# Patient Record
Sex: Male | Born: 1950
Health system: Southern US, Community
[De-identification: ages and names within clinical notes are randomized; demographics above are authoritative.]

## PROBLEM LIST (undated history)

## (undated) DIAGNOSIS — N200 Calculus of kidney: Secondary | ICD-10-CM

## (undated) DIAGNOSIS — E119 Type 2 diabetes mellitus without complications: Secondary | ICD-10-CM

## (undated) DIAGNOSIS — E079 Disorder of thyroid, unspecified: Secondary | ICD-10-CM

## (undated) DIAGNOSIS — G4730 Sleep apnea, unspecified: Secondary | ICD-10-CM

## (undated) DIAGNOSIS — E039 Hypothyroidism, unspecified: Secondary | ICD-10-CM

## (undated) DIAGNOSIS — E7800 Pure hypercholesterolemia, unspecified: Secondary | ICD-10-CM

## (undated) HISTORY — PX: COLONOSCOPY: SHX174

## (undated) HISTORY — DX: Sleep apnea, unspecified: G47.30

## (undated) HISTORY — DX: Disorder of thyroid, unspecified: E07.9

## (undated) HISTORY — DX: Hypothyroidism, unspecified: E03.9

## (undated) HISTORY — DX: Calculus of kidney: N20.0

---

## 2004-04-03 ENCOUNTER — Ambulatory Visit (HOSPITAL_COMMUNITY): Admission: RE | Admit: 2004-04-03 | Discharge: 2004-04-03 | Payer: Self-pay | Admitting: Family Medicine

## 2013-02-21 ENCOUNTER — Ambulatory Visit (INDEPENDENT_AMBULATORY_CARE_PROVIDER_SITE_OTHER): Payer: BC Managed Care – PPO | Admitting: Endocrinology

## 2013-02-21 ENCOUNTER — Encounter: Payer: Self-pay | Admitting: Endocrinology

## 2013-02-21 VITALS — BP 134/80 | HR 80 | Ht 67.0 in | Wt 208.0 lb

## 2013-02-21 DIAGNOSIS — N209 Urinary calculus, unspecified: Secondary | ICD-10-CM

## 2013-02-21 DIAGNOSIS — E0590 Thyrotoxicosis, unspecified without thyrotoxic crisis or storm: Secondary | ICD-10-CM

## 2013-02-21 DIAGNOSIS — E785 Hyperlipidemia, unspecified: Secondary | ICD-10-CM | POA: Insufficient documentation

## 2013-02-21 NOTE — Patient Instructions (Signed)
let's check a thyroid "scan" (a special, but easy and painless type of thyroid x ray).  you will receive a phone call, about a day and time for an appointment.  It works like this: you go to the x-ray department of the hospital to swallow a pill, which contains a miniscule amount of radiation.  You will not notice any symptoms from this.  You will go back to the x-ray department the next day, to lie down in front of a camera.  The results of this will be sent to me.   Based on the results, i hope to order for you a treatment pill of radioactive iodine.  Although it is a larger amount of radiation, you will again notice no symptoms from this.  The pill is gone from your body in a few days (during which you should stay away from other people), but takes several months to work.  Therefore, please return here approximately 6-8 weeks after the treatment.  This treatment has been available for many years, and the only known side-effect is an underactive thyroid.  It is possible that i would eventually prescribe for you a thyroid hormone pill, which is very inexpensive.  You don't have to worry about side-effects of this thyroid hormone pill, because it is the same molecule your thyroid makes.

## 2013-02-21 NOTE — Progress Notes (Signed)
  Subjective:    Patient ID: Darrell Rivas, male    DOB: 12-06-1950, 62 y.o.   MRN: [id number]  HPI Pt says approx 10 years ago, he was noted to have slight proptosis of the right eye.  He was dx'ed with grave's dz.  He was found to have associated abnormal TFT, and was rx'ed with an uncertain type of thyroid medication.  He has been off this for a few years, with normal TFT.  approx 4 years ago, he had aspiration of fluid from a thyroid cyst.  The TFT stayed normal until the most recent one.  He was rx'ed with toprol, but says he takes it just prn.    No past medical history on file.  No past surgical history on file.  History   Social History  . Marital Status: Widowed    Spouse Name: N/A    Number of Children: N/A  . Years of Education: N/A   Occupational History  . Not on file.   Social History Main Topics  . Smoking status: Not on file  . Smokeless tobacco: Not on file  . Alcohol Use: Not on file  . Drug Use: Not on file  . Sexually Active: Not on file   Other Topics Concern  . Not on file   Social History Narrative  . No narrative on file    No current outpatient prescriptions on file prior to visit.   No current facility-administered medications on file prior to visit.    Not on File  No family history on file. No thyroid probs BP 134/80  Pulse 80  Ht 5\' 6"  (1.676 m)  Wt 208 lb (94.348 kg)  BMI 33.59 kg/m2  SpO2 98%    Review of Systems He has slight headache and heat intolerance.  denies weight loss, hoarseness, double vision, palpitations, sob, diarrhea, polyuria, myalgias, excessive diaphoresis, numbness, tremor, anxiety, hypoglycemia, easy bruising, and rhinorrhea.      Objective:   Physical Exam VS: see vs page GEN: no distress HEAD: head: no deformity eyes: no periorbital swelling, but there is slight right-sided proptosis external nose and ears are normal mouth: no lesion seen NECK: supple, thyroid is not enlarged CHEST WALL: no  deformity LUNGS: clear to auscultation BREASTS:  No gynecomastia CV: reg rate and rhythm, no murmur. ABD: abdomen is soft, nontender.  no hepatosplenomegaly.  not distended.  no hernia MUSCULOSKELETAL: muscle bulk and strength are grossly normal.  no obvious joint swelling.  gait is normal and steady.   EXTEMITIES: no deformity.  no ulcer on the feet.  feet are of normal color and temp.  Trace bilat leg edema.   PULSES: dorsalis pedis intact bilat.  no carotid bruit.   NEURO:  cn 2-12 grossly intact.   readily moves all 4's.  sensation is intact to touch on all 4's.  No tremor. SKIN:  Normal texture and temperature.  No rash or suspicious lesion is visible.  Slightly diaphoretic.   NODES:  None palpable at the neck.   PSYCH: alert, oriented x3.  Does not appear anxious nor depressed.   outside test results are reviewed: TSH=0.005 Free T4=2.8    Assessment & Plan:  Grave's dz, affecting the right eye Hyperthyroidism, due to the grave's dz Apparent h/o thyroid cyst.  Not apparent on physical exam today.

## 2013-03-08 ENCOUNTER — Telehealth: Payer: Self-pay | Admitting: Endocrinology

## 2013-03-08 ENCOUNTER — Other Ambulatory Visit: Payer: Self-pay | Admitting: Endocrinology

## 2013-03-08 DIAGNOSIS — E0590 Thyrotoxicosis, unspecified without thyrotoxic crisis or storm: Secondary | ICD-10-CM

## 2013-03-08 NOTE — Telephone Encounter (Signed)
please call patient: i ordered iodine therapy.  you will receive a phone call, about a day and time for an appointment. Please come back for a follow-up appointment 6 weeks later.

## 2013-03-24 ENCOUNTER — Encounter: Payer: Self-pay | Admitting: Endocrinology

## 2013-03-27 ENCOUNTER — Encounter (HOSPITAL_COMMUNITY): Payer: BC Managed Care – PPO

## 2013-03-28 ENCOUNTER — Encounter (HOSPITAL_COMMUNITY): Payer: BC Managed Care – PPO

## 2013-04-04 ENCOUNTER — Telehealth: Payer: Self-pay

## 2013-04-04 ENCOUNTER — Encounter (HOSPITAL_COMMUNITY)
Admission: RE | Admit: 2013-04-04 | Discharge: 2013-04-04 | Disposition: A | Discharge status: Home / Self Care | Payer: BC Managed Care – PPO | Source: Ambulatory Visit | Attending: Endocrinology | Admitting: Endocrinology

## 2013-04-04 ENCOUNTER — Other Ambulatory Visit: Payer: Self-pay | Admitting: Endocrinology

## 2013-04-04 ENCOUNTER — Encounter (HOSPITAL_COMMUNITY): Admission: RE | Admit: 2013-04-04 | Payer: BC Managed Care – PPO | Source: Ambulatory Visit

## 2013-04-04 ENCOUNTER — Encounter (HOSPITAL_COMMUNITY): Payer: Self-pay

## 2013-04-04 DIAGNOSIS — E0590 Thyrotoxicosis, unspecified without thyrotoxic crisis or storm: Secondary | ICD-10-CM | POA: Insufficient documentation

## 2013-04-04 MED ORDER — SODIUM IODIDE I 131 CAPSULE
30.2000 | Freq: Once | INTRAVENOUS | Status: AC | PRN
  Administered 2013-04-04: 30.2 via ORAL

## 2013-04-04 NOTE — Telephone Encounter (Signed)
Darrell Rivas from radiology called pt had a thyroid uptake and scan at [facility] on 5 /28/14, pt does not needs another, will cancel uptake and do therapy

## 2013-04-05 ENCOUNTER — Encounter (HOSPITAL_COMMUNITY): Payer: BC Managed Care – PPO

## 2013-04-06 ENCOUNTER — Encounter (HOSPITAL_COMMUNITY): Payer: BC Managed Care – PPO

## 2013-07-21 ENCOUNTER — Telehealth: Payer: Self-pay | Admitting: Endocrinology

## 2013-07-21 NOTE — Telephone Encounter (Signed)
Pt wanted to male a f/u appt  Scheduled for Monday at 4 pm

## 2013-07-24 ENCOUNTER — Ambulatory Visit (INDEPENDENT_AMBULATORY_CARE_PROVIDER_SITE_OTHER): Payer: BC Managed Care – PPO | Admitting: Endocrinology

## 2013-07-24 ENCOUNTER — Encounter: Payer: Self-pay | Admitting: Endocrinology

## 2013-07-24 VITALS — BP 140/90 | HR 69 | Temp 98.3°F | Wt 228.8 lb

## 2013-07-24 DIAGNOSIS — E890 Postprocedural hypothyroidism: Secondary | ICD-10-CM

## 2013-07-24 DIAGNOSIS — E0590 Thyrotoxicosis, unspecified without thyrotoxic crisis or storm: Secondary | ICD-10-CM

## 2013-07-24 NOTE — Progress Notes (Signed)
  Subjective:    Patient ID: Darrell Rivas, male    DOB: 23-Jun-1951, 62 y.o.   MRN: [id number]  HPI In 2004, pt was noted to have slight proptosis of the right eye.  He was dx'ed with grave's dz.  He was found to have associated abnormal TFT, and was rx'ed with an uncertain type of thyroid medication.  He has been off this since approx 2012, with normal TFT.  In approx 2010, he had aspiration of fluid from a thyroid cyst.  The TFT stayed normal until mid-2014.  He had 1-131 rx in July of 2014.  In august of 2014, he was seen in [facility] ER for moderate lump at the anterior neck, but no assoc pain.  He says it is smaller since then. Past Medical History  Diagnosis Date  . Thyroid disease     No past surgical history on file.  History   Social History  . Marital Status: Widowed    Spouse Name: N/A    Number of Children: N/A  . Years of Education: N/A   Occupational History  . Not on file.   Social History Main Topics  . Smoking status: Never Smoker   . Smokeless tobacco: Not on file  . Alcohol Use: No  . Drug Use: Not on file  . Sexual Activity: Not on file   Other Topics Concern  . Not on file   Social History Narrative  . No narrative on file    No current outpatient prescriptions on file prior to visit.   No current facility-administered medications on file prior to visit.    Not on File  Family History  Problem Relation Age of Onset  . Cancer Mother   . Stroke Father   . Cancer Sister   . Diabetes Maternal Grandmother     BP 140/90  Pulse 69  Temp(Src) 98.3 F (36.8 C) (Oral)  Wt 228 lb 12.8 oz (103.783 kg)  BMI 35.83 kg/m2  SpO2 97%  Review of Systems He has weight gain and hoarseness.      Objective:   Physical Exam VITAL SIGNS:  See vs page GENERAL: no distress no periorbital swelling, but there is slight right-sided proptosis Neck: 3 cm nodule at the thyroid isthmus    outside test results are reviewed: i reviewed [place] report.   Lab  Results  Component Value Date   TSH 50.84* 07/24/2013      Assessment & Plan:  Grave's dz, affecting the right eye, unchanged Post-i-131 hypothyroidism, new. thyroid cyst, recurrent.  This may resolve on its own, without specific rx.

## 2013-07-24 NOTE — Patient Instructions (Addendum)
blood tests are being requested for you today.  We'll contact you with results. Please come back for a follow-up appointment in 4-6 weeks. 

## 2013-07-25 DIAGNOSIS — E890 Postprocedural hypothyroidism: Secondary | ICD-10-CM | POA: Insufficient documentation

## 2013-07-25 LAB — T4, FREE: Free T4: 0.06 ng/dL — ABNORMAL LOW (ref 0.60–1.60)

## 2013-07-26 ENCOUNTER — Telehealth: Payer: Self-pay

## 2013-07-26 MED ORDER — LEVOTHYROXINE SODIUM 100 MCG PO TABS: 100.0000 ug | ORAL_TABLET | Freq: Every day | ORAL | Status: DC

## 2013-07-26 NOTE — Telephone Encounter (Signed)
i sent rx 

## 2013-07-26 NOTE — Telephone Encounter (Signed)
Pt left voicemail stating he was here on Monday and needs rx for thyroid med sent to [facility] in [place]

## 2013-07-27 ENCOUNTER — Telehealth: Payer: Self-pay | Admitting: Endocrinology

## 2013-07-27 ENCOUNTER — Other Ambulatory Visit: Payer: Self-pay | Admitting: *Deleted

## 2013-07-27 ENCOUNTER — Telehealth: Payer: Self-pay | Admitting: *Deleted

## 2013-07-27 MED ORDER — LEVOTHYROXINE SODIUM 100 MCG PO TABS: 100.0000 ug | ORAL_TABLET | Freq: Every day | ORAL | Status: DC

## 2013-07-27 NOTE — Telephone Encounter (Signed)
rx did not go through

## 2013-07-27 NOTE — Telephone Encounter (Signed)
Rx for [facility]othyroxine sent to [facility] [place] at lunch today. Tried to call pt and let him know. No answer.

## 2013-07-27 NOTE — Telephone Encounter (Signed)
Pt advised rx called in.

## 2013-07-27 NOTE — Telephone Encounter (Signed)
Message sent earlier regarding pt's RX. Please make sure this came to you, I may have closed encounter earlier in ERROR. Thanks! Sherri

## 2013-07-27 NOTE — Telephone Encounter (Signed)
Pt has called multiple times, says [facility]. Lamar Laundry has already left today. Please check into status of refills and call pt back. He is upset that this has not been taken care of / Sherri

## 2013-08-28 ENCOUNTER — Ambulatory Visit (INDEPENDENT_AMBULATORY_CARE_PROVIDER_SITE_OTHER): Payer: BC Managed Care – PPO | Admitting: Endocrinology

## 2013-08-28 ENCOUNTER — Encounter: Payer: Self-pay | Admitting: Endocrinology

## 2013-08-28 VITALS — BP 126/82 | HR 72 | Temp 98.0°F | Resp 16 | Ht 67.5 in | Wt 220.3 lb

## 2013-08-28 DIAGNOSIS — E890 Postprocedural hypothyroidism: Secondary | ICD-10-CM

## 2013-08-28 MED ORDER — LEVOTHYROXINE SODIUM 150 MCG PO TABS: 150.0000 ug | ORAL_TABLET | Freq: Every day | ORAL | Status: DC

## 2013-08-28 NOTE — Progress Notes (Signed)
  Subjective:    Patient ID: Darrell Rivas, male    DOB: Feb 21, 1951, 62 y.o.   MRN: [id number]  HPI In 2004, pt was noted to have slight proptosis of the right eye.  He was dx'ed with grave's dz.  He was found to have associated abnormal TFT, and was rx'ed with an uncertain type of thyroid medication.  He has been off this since approx 2012, with normal TFT.  In approx 2010, he had aspiration of fluid from a thyroid cyst.  The TFT stayed normal until mid-2014, when he became hyperthyroid.  He had 1-131 rx in July of 2014.  In august of 2014, he was seen in [facility] ER for moderate lump at the anterior neck, but no assoc pain.  He says it is much smaller since then.  In October of 2014, pt was noted to have an elevated TSH, and synthroid was started.  pt states he feels well in general, except for fatigue.   Past Medical History  Diagnosis Date  . Thyroid disease     No past surgical history on file.  History   Social History  . Marital Status: Widowed    Spouse Name: N/A    Number of Children: N/A  . Years of Education: N/A   Occupational History  . Not on file.   Social History Main Topics  . Smoking status: Never Smoker   . Smokeless tobacco: Not on file  . Alcohol Use: No  . Drug Use: Not on file  . Sexual Activity: Not on file   Other Topics Concern  . Not on file   Social History Narrative  . No narrative on file    No current outpatient prescriptions on file prior to visit.   No current facility-administered medications on file prior to visit.    Not on File  Family History  Problem Relation Age of Onset  . Cancer Mother   . Stroke Father   . Cancer Sister   . Diabetes Maternal Grandmother    BP 126/82  Pulse 72  Temp(Src) 98 F (36.7 C) (Oral)  Resp 16  Ht 5' 7.5" (1.715 m)  Wt 220 lb 4.8 oz (99.927 kg)  BMI 33.97 kg/m2  Review of Systems Denies weight change    Objective:   Physical Exam VITAL SIGNS:  See vs page GENERAL: no distress  NECK:  There is no palpable thyroid enlargement.  No thyroid nodule is palpable.  No palpable lymphadenopathy at the anterior neck.   outside test results are reviewed: (on synthroid x 2 weeks) TSH=47    Assessment & Plan:  Post-i-131 hypothyroidism: needs increased rx Goiter: much better

## 2013-08-28 NOTE — Patient Instructions (Addendum)
i have sent a prescription to your pharmacy, to increase the levothyroxine.   Please come back for a follow-up appointment in 6 weeks.

## 2013-09-13 ENCOUNTER — Other Ambulatory Visit: Payer: Self-pay

## 2013-09-13 MED ORDER — RED YEAST RICE 600 MG PO TABS: 600.0000 mg | ORAL_TABLET | Freq: Every day | ORAL | Status: DC

## 2013-09-14 ENCOUNTER — Other Ambulatory Visit: Payer: Self-pay

## 2013-09-14 MED ORDER — LEVOTHYROXINE SODIUM 150 MCG PO TABS: 150.0000 ug | ORAL_TABLET | Freq: Every day | ORAL | Status: DC

## 2013-09-14 NOTE — Telephone Encounter (Signed)
Levothyroxine refilled

## 2013-10-19 ENCOUNTER — Other Ambulatory Visit: Payer: Self-pay

## 2013-10-19 MED ORDER — LEVOTHYROXINE SODIUM 150 MCG PO TABS: 150.0000 ug | ORAL_TABLET | Freq: Every day | ORAL | Status: DC

## 2013-10-24 ENCOUNTER — Encounter: Payer: Self-pay | Admitting: Endocrinology

## 2013-10-24 ENCOUNTER — Ambulatory Visit (INDEPENDENT_AMBULATORY_CARE_PROVIDER_SITE_OTHER): Payer: BC Managed Care – PPO | Admitting: Endocrinology

## 2013-10-24 VITALS — BP 120/82 | HR 74 | Temp 97.9°F | Ht 67.5 in | Wt 224.0 lb

## 2013-10-24 DIAGNOSIS — E890 Postprocedural hypothyroidism: Secondary | ICD-10-CM

## 2013-10-24 LAB — TSH: TSH: 1.001 u[IU]/mL (ref 0.350–4.500)

## 2013-10-24 NOTE — Progress Notes (Signed)
   Subjective:    Patient ID: Darrell Rivas, male    DOB: 09-25-1951, 63 y.o.   MRN: [id number]  HPI In 2004, pt was noted to have slight proptosis of the right eye.  He was dx'ed with grave's dz.  He was found to have associated abnormal TFT, and was rx'ed with an uncertain type of thyroid medication.  He has been off this since approx 2012, with normal TFT.  In approx 2010, he had aspiration of fluid from a thyroid cyst.  The TFT stayed normal until mid-2014, when he became hyperthyroid.  He had 1-131 rx in July of 2014.  In august of 2014, he was seen in [facility] ER for a thyroid mass, but it resolved without any further rx.  In October of 2014, pt was noted to have an elevated TSH, and synthroid was started.  pt states he feels well in general, except for fatigue.   Past Medical History  Diagnosis Date  . Thyroid disease     No past surgical history on file.  History   Social History  . Marital Status: Widowed    Spouse Name: N/A    Number of Children: N/A  . Years of Education: N/A   Occupational History  . Not on file.   Social History Main Topics  . Smoking status: Never Smoker   . Smokeless tobacco: Not on file  . Alcohol Use: No  . Drug Use: Not on file  . Sexual Activity: Not on file   Other Topics Concern  . Not on file   Social History Narrative  . No narrative on file    Current Outpatient Prescriptions on File Prior to Visit  Medication Sig Dispense Refill  . levothyroxine (SYNTHROID, LEVOTHROID) 150 MCG tablet Take 1 tablet (150 mcg total) by mouth daily before breakfast.  90 tablet  0  . Red Yeast Rice 600 MG TABS Take 1 tablet (600 mg total) by mouth daily.  90 tablet  0   No current facility-administered medications on file prior to visit.    Not on File  Family History  Problem Relation Age of Onset  . Cancer Mother   . Stroke Father   . Cancer Sister   . Diabetes Maternal Grandmother     BP 120/82  Pulse 74  Temp(Src) 97.9 F (36.6 C)  (Oral)  Ht 5' 7.5" (1.715 m)  Wt 224 lb (101.606 kg)  BMI 34.55 kg/m2  SpO2 95%   Review of Systems Denies weight change    Objective:   Physical Exam VITAL SIGNS:  See vs page GENERAL: no distress NECK: There is no palpable thyroid enlargement.  No thyroid nodule is palpable.  No palpable lymphadenopathy at the anterior neck.     Lab Results  Component Value Date   TSH 1.001 10/24/2013      Assessment & Plan:  Post-i-131 hypothyroidism: well-replaced

## 2013-10-24 NOTE — Patient Instructions (Signed)
blood tests are being requested for you today.  We'll contact you with results.   Please come back for a follow-up appointment in 3 months.    

## 2015-02-01 DIAGNOSIS — E0590 Thyrotoxicosis, unspecified without thyrotoxic crisis or storm: Secondary | ICD-10-CM | POA: Diagnosis not present

## 2015-02-01 DIAGNOSIS — E559 Vitamin D deficiency, unspecified: Secondary | ICD-10-CM | POA: Diagnosis not present

## 2015-02-01 DIAGNOSIS — N400 Enlarged prostate without lower urinary tract symptoms: Secondary | ICD-10-CM | POA: Diagnosis not present

## 2015-02-01 DIAGNOSIS — R5382 Chronic fatigue, unspecified: Secondary | ICD-10-CM | POA: Diagnosis not present

## 2015-02-01 DIAGNOSIS — E782 Mixed hyperlipidemia: Secondary | ICD-10-CM | POA: Diagnosis not present

## 2015-02-01 DIAGNOSIS — E039 Hypothyroidism, unspecified: Secondary | ICD-10-CM | POA: Diagnosis not present

## 2015-02-08 DIAGNOSIS — Z0001 Encounter for general adult medical examination with abnormal findings: Secondary | ICD-10-CM | POA: Diagnosis not present

## 2015-02-08 DIAGNOSIS — Z1389 Encounter for screening for other disorder: Secondary | ICD-10-CM | POA: Diagnosis not present

## 2015-02-08 DIAGNOSIS — E782 Mixed hyperlipidemia: Secondary | ICD-10-CM | POA: Diagnosis not present

## 2015-03-08 DIAGNOSIS — J189 Pneumonia, unspecified organism: Secondary | ICD-10-CM | POA: Diagnosis not present

## 2015-05-29 DIAGNOSIS — H6991 Unspecified Eustachian tube disorder, right ear: Secondary | ICD-10-CM | POA: Diagnosis not present

## 2015-06-07 DIAGNOSIS — H6091 Unspecified otitis externa, right ear: Secondary | ICD-10-CM | POA: Diagnosis not present

## 2015-08-14 DIAGNOSIS — G4733 Obstructive sleep apnea (adult) (pediatric): Secondary | ICD-10-CM | POA: Diagnosis not present

## 2015-08-14 DIAGNOSIS — J019 Acute sinusitis, unspecified: Secondary | ICD-10-CM | POA: Diagnosis not present

## 2015-09-11 DIAGNOSIS — G4733 Obstructive sleep apnea (adult) (pediatric): Secondary | ICD-10-CM | POA: Diagnosis not present

## 2015-09-25 DIAGNOSIS — G4733 Obstructive sleep apnea (adult) (pediatric): Secondary | ICD-10-CM | POA: Diagnosis not present

## 2015-09-26 DIAGNOSIS — G4733 Obstructive sleep apnea (adult) (pediatric): Secondary | ICD-10-CM | POA: Diagnosis not present

## 2015-11-15 DIAGNOSIS — G4733 Obstructive sleep apnea (adult) (pediatric): Secondary | ICD-10-CM | POA: Diagnosis not present

## 2015-12-13 DIAGNOSIS — G4733 Obstructive sleep apnea (adult) (pediatric): Secondary | ICD-10-CM | POA: Diagnosis not present

## 2016-01-02 DIAGNOSIS — N451 Epididymitis: Secondary | ICD-10-CM | POA: Diagnosis not present

## 2016-01-13 DIAGNOSIS — G4733 Obstructive sleep apnea (adult) (pediatric): Secondary | ICD-10-CM | POA: Diagnosis not present

## 2016-02-12 DIAGNOSIS — G4733 Obstructive sleep apnea (adult) (pediatric): Secondary | ICD-10-CM | POA: Diagnosis not present

## 2016-02-24 DIAGNOSIS — R05 Cough: Secondary | ICD-10-CM | POA: Diagnosis not present

## 2016-02-28 DIAGNOSIS — J209 Acute bronchitis, unspecified: Secondary | ICD-10-CM | POA: Diagnosis not present

## 2016-02-28 DIAGNOSIS — R05 Cough: Secondary | ICD-10-CM | POA: Diagnosis not present

## 2016-03-14 DIAGNOSIS — G4733 Obstructive sleep apnea (adult) (pediatric): Secondary | ICD-10-CM | POA: Diagnosis not present

## 2016-03-19 DIAGNOSIS — G4733 Obstructive sleep apnea (adult) (pediatric): Secondary | ICD-10-CM | POA: Diagnosis not present

## 2016-04-13 DIAGNOSIS — G4733 Obstructive sleep apnea (adult) (pediatric): Secondary | ICD-10-CM | POA: Diagnosis not present

## 2016-05-07 DIAGNOSIS — E039 Hypothyroidism, unspecified: Secondary | ICD-10-CM | POA: Diagnosis not present

## 2016-05-07 DIAGNOSIS — E559 Vitamin D deficiency, unspecified: Secondary | ICD-10-CM | POA: Diagnosis not present

## 2016-05-07 DIAGNOSIS — R5382 Chronic fatigue, unspecified: Secondary | ICD-10-CM | POA: Diagnosis not present

## 2016-05-07 DIAGNOSIS — E782 Mixed hyperlipidemia: Secondary | ICD-10-CM | POA: Diagnosis not present

## 2016-05-07 DIAGNOSIS — N400 Enlarged prostate without lower urinary tract symptoms: Secondary | ICD-10-CM | POA: Diagnosis not present

## 2016-05-12 DIAGNOSIS — L989 Disorder of the skin and subcutaneous tissue, unspecified: Secondary | ICD-10-CM | POA: Diagnosis not present

## 2016-05-12 DIAGNOSIS — L720 Epidermal cyst: Secondary | ICD-10-CM | POA: Diagnosis not present

## 2016-05-12 DIAGNOSIS — D225 Melanocytic nevi of trunk: Secondary | ICD-10-CM | POA: Diagnosis not present

## 2016-05-12 DIAGNOSIS — Z0001 Encounter for general adult medical examination with abnormal findings: Secondary | ICD-10-CM | POA: Diagnosis not present

## 2016-05-12 DIAGNOSIS — E782 Mixed hyperlipidemia: Secondary | ICD-10-CM | POA: Diagnosis not present

## 2016-05-14 DIAGNOSIS — G4733 Obstructive sleep apnea (adult) (pediatric): Secondary | ICD-10-CM | POA: Diagnosis not present

## 2016-06-14 DIAGNOSIS — G4733 Obstructive sleep apnea (adult) (pediatric): Secondary | ICD-10-CM | POA: Diagnosis not present

## 2016-06-22 DIAGNOSIS — S39011A Strain of muscle, fascia and tendon of abdomen, initial encounter: Secondary | ICD-10-CM | POA: Diagnosis not present

## 2016-06-22 DIAGNOSIS — D225 Melanocytic nevi of trunk: Secondary | ICD-10-CM | POA: Diagnosis not present

## 2016-07-14 DIAGNOSIS — G4733 Obstructive sleep apnea (adult) (pediatric): Secondary | ICD-10-CM | POA: Diagnosis not present

## 2016-08-04 DIAGNOSIS — G4733 Obstructive sleep apnea (adult) (pediatric): Secondary | ICD-10-CM | POA: Diagnosis not present

## 2016-08-13 DIAGNOSIS — M545 Low back pain: Secondary | ICD-10-CM | POA: Diagnosis not present

## 2016-08-14 DIAGNOSIS — G4733 Obstructive sleep apnea (adult) (pediatric): Secondary | ICD-10-CM | POA: Diagnosis not present

## 2016-08-20 DIAGNOSIS — Z808 Family history of malignant neoplasm of other organs or systems: Secondary | ICD-10-CM | POA: Diagnosis not present

## 2016-08-20 DIAGNOSIS — Z803 Family history of malignant neoplasm of breast: Secondary | ICD-10-CM | POA: Diagnosis not present

## 2016-08-20 DIAGNOSIS — E039 Hypothyroidism, unspecified: Secondary | ICD-10-CM | POA: Diagnosis not present

## 2016-08-20 DIAGNOSIS — Z823 Family history of stroke: Secondary | ICD-10-CM | POA: Diagnosis not present

## 2016-08-20 DIAGNOSIS — Z79899 Other long term (current) drug therapy: Secondary | ICD-10-CM | POA: Diagnosis not present

## 2016-08-20 DIAGNOSIS — Z1211 Encounter for screening for malignant neoplasm of colon: Secondary | ICD-10-CM | POA: Diagnosis not present

## 2016-08-25 DIAGNOSIS — M545 Low back pain: Secondary | ICD-10-CM | POA: Diagnosis not present

## 2016-08-26 DIAGNOSIS — Z800 Family history of malignant neoplasm of digestive organs: Secondary | ICD-10-CM | POA: Diagnosis not present

## 2017-01-27 DIAGNOSIS — J029 Acute pharyngitis, unspecified: Secondary | ICD-10-CM | POA: Diagnosis not present

## 2017-04-01 DIAGNOSIS — M545 Low back pain: Secondary | ICD-10-CM | POA: Diagnosis not present

## 2017-05-27 DIAGNOSIS — E782 Mixed hyperlipidemia: Secondary | ICD-10-CM | POA: Diagnosis not present

## 2017-05-27 DIAGNOSIS — E0590 Thyrotoxicosis, unspecified without thyrotoxic crisis or storm: Secondary | ICD-10-CM | POA: Diagnosis not present

## 2017-05-27 DIAGNOSIS — E039 Hypothyroidism, unspecified: Secondary | ICD-10-CM | POA: Diagnosis not present

## 2017-05-27 DIAGNOSIS — E559 Vitamin D deficiency, unspecified: Secondary | ICD-10-CM | POA: Diagnosis not present

## 2017-05-27 DIAGNOSIS — G4733 Obstructive sleep apnea (adult) (pediatric): Secondary | ICD-10-CM | POA: Diagnosis not present

## 2017-05-31 DIAGNOSIS — E559 Vitamin D deficiency, unspecified: Secondary | ICD-10-CM | POA: Diagnosis not present

## 2017-05-31 DIAGNOSIS — E041 Nontoxic single thyroid nodule: Secondary | ICD-10-CM | POA: Diagnosis not present

## 2017-05-31 DIAGNOSIS — E039 Hypothyroidism, unspecified: Secondary | ICD-10-CM | POA: Diagnosis not present

## 2017-05-31 DIAGNOSIS — E782 Mixed hyperlipidemia: Secondary | ICD-10-CM | POA: Diagnosis not present

## 2017-05-31 DIAGNOSIS — Z0001 Encounter for general adult medical examination with abnormal findings: Secondary | ICD-10-CM | POA: Diagnosis not present

## 2017-06-03 DIAGNOSIS — E041 Nontoxic single thyroid nodule: Secondary | ICD-10-CM | POA: Diagnosis not present

## 2017-06-03 DIAGNOSIS — E039 Hypothyroidism, unspecified: Secondary | ICD-10-CM | POA: Diagnosis not present

## 2017-06-22 DIAGNOSIS — G4733 Obstructive sleep apnea (adult) (pediatric): Secondary | ICD-10-CM | POA: Diagnosis not present

## 2017-07-13 DIAGNOSIS — M25561 Pain in right knee: Secondary | ICD-10-CM | POA: Diagnosis not present

## 2017-07-19 DIAGNOSIS — M1711 Unilateral primary osteoarthritis, right knee: Secondary | ICD-10-CM | POA: Diagnosis not present

## 2017-07-19 DIAGNOSIS — M545 Low back pain: Secondary | ICD-10-CM | POA: Diagnosis not present

## 2017-07-28 DIAGNOSIS — M1711 Unilateral primary osteoarthritis, right knee: Secondary | ICD-10-CM | POA: Diagnosis not present

## 2017-07-28 DIAGNOSIS — S83241A Other tear of medial meniscus, current injury, right knee, initial encounter: Secondary | ICD-10-CM | POA: Diagnosis not present

## 2017-07-28 DIAGNOSIS — M7121 Synovial cyst of popliteal space [Baker], right knee: Secondary | ICD-10-CM | POA: Diagnosis not present

## 2017-07-28 DIAGNOSIS — M179 Osteoarthritis of knee, unspecified: Secondary | ICD-10-CM | POA: Diagnosis not present

## 2017-08-03 DIAGNOSIS — M1711 Unilateral primary osteoarthritis, right knee: Secondary | ICD-10-CM | POA: Diagnosis not present

## 2017-08-03 DIAGNOSIS — S83241D Other tear of medial meniscus, current injury, right knee, subsequent encounter: Secondary | ICD-10-CM | POA: Diagnosis not present

## 2017-09-10 DIAGNOSIS — Z23 Encounter for immunization: Secondary | ICD-10-CM | POA: Diagnosis not present

## 2017-09-21 DIAGNOSIS — S83241D Other tear of medial meniscus, current injury, right knee, subsequent encounter: Secondary | ICD-10-CM | POA: Diagnosis not present

## 2017-09-21 DIAGNOSIS — M1711 Unilateral primary osteoarthritis, right knee: Secondary | ICD-10-CM | POA: Diagnosis not present

## 2017-10-19 DIAGNOSIS — M1711 Unilateral primary osteoarthritis, right knee: Secondary | ICD-10-CM | POA: Diagnosis not present

## 2017-10-22 DIAGNOSIS — H9201 Otalgia, right ear: Secondary | ICD-10-CM | POA: Diagnosis not present

## 2017-10-26 DIAGNOSIS — M1711 Unilateral primary osteoarthritis, right knee: Secondary | ICD-10-CM | POA: Diagnosis not present

## 2017-11-02 DIAGNOSIS — M1711 Unilateral primary osteoarthritis, right knee: Secondary | ICD-10-CM | POA: Diagnosis not present

## 2017-11-22 DIAGNOSIS — E041 Nontoxic single thyroid nodule: Secondary | ICD-10-CM | POA: Diagnosis not present

## 2017-11-22 DIAGNOSIS — E782 Mixed hyperlipidemia: Secondary | ICD-10-CM | POA: Diagnosis not present

## 2017-11-22 DIAGNOSIS — E559 Vitamin D deficiency, unspecified: Secondary | ICD-10-CM | POA: Diagnosis not present

## 2017-11-22 DIAGNOSIS — E039 Hypothyroidism, unspecified: Secondary | ICD-10-CM | POA: Diagnosis not present

## 2017-11-22 DIAGNOSIS — E0590 Thyrotoxicosis, unspecified without thyrotoxic crisis or storm: Secondary | ICD-10-CM | POA: Diagnosis not present

## 2017-11-25 DIAGNOSIS — E782 Mixed hyperlipidemia: Secondary | ICD-10-CM | POA: Diagnosis not present

## 2017-11-25 DIAGNOSIS — E039 Hypothyroidism, unspecified: Secondary | ICD-10-CM | POA: Diagnosis not present

## 2017-11-25 DIAGNOSIS — M1711 Unilateral primary osteoarthritis, right knee: Secondary | ICD-10-CM | POA: Diagnosis not present

## 2017-11-25 DIAGNOSIS — E559 Vitamin D deficiency, unspecified: Secondary | ICD-10-CM | POA: Diagnosis not present

## 2017-12-14 DIAGNOSIS — S83241D Other tear of medial meniscus, current injury, right knee, subsequent encounter: Secondary | ICD-10-CM | POA: Diagnosis not present

## 2017-12-14 DIAGNOSIS — M1711 Unilateral primary osteoarthritis, right knee: Secondary | ICD-10-CM | POA: Diagnosis not present

## 2018-01-25 DIAGNOSIS — E039 Hypothyroidism, unspecified: Secondary | ICD-10-CM | POA: Diagnosis not present

## 2018-02-07 DIAGNOSIS — R05 Cough: Secondary | ICD-10-CM | POA: Diagnosis not present

## 2018-02-07 DIAGNOSIS — J209 Acute bronchitis, unspecified: Secondary | ICD-10-CM | POA: Diagnosis not present

## 2018-05-26 DIAGNOSIS — E0590 Thyrotoxicosis, unspecified without thyrotoxic crisis or storm: Secondary | ICD-10-CM | POA: Diagnosis not present

## 2018-05-26 DIAGNOSIS — G4733 Obstructive sleep apnea (adult) (pediatric): Secondary | ICD-10-CM | POA: Diagnosis not present

## 2018-05-26 DIAGNOSIS — E041 Nontoxic single thyroid nodule: Secondary | ICD-10-CM | POA: Diagnosis not present

## 2018-05-26 DIAGNOSIS — E039 Hypothyroidism, unspecified: Secondary | ICD-10-CM | POA: Diagnosis not present

## 2018-05-26 DIAGNOSIS — E782 Mixed hyperlipidemia: Secondary | ICD-10-CM | POA: Diagnosis not present

## 2018-05-26 DIAGNOSIS — E559 Vitamin D deficiency, unspecified: Secondary | ICD-10-CM | POA: Diagnosis not present

## 2018-05-30 DIAGNOSIS — E039 Hypothyroidism, unspecified: Secondary | ICD-10-CM | POA: Diagnosis not present

## 2018-05-30 DIAGNOSIS — E559 Vitamin D deficiency, unspecified: Secondary | ICD-10-CM | POA: Diagnosis not present

## 2018-05-30 DIAGNOSIS — Z0001 Encounter for general adult medical examination with abnormal findings: Secondary | ICD-10-CM | POA: Diagnosis not present

## 2018-05-30 DIAGNOSIS — E782 Mixed hyperlipidemia: Secondary | ICD-10-CM | POA: Diagnosis not present

## 2018-05-30 DIAGNOSIS — M1711 Unilateral primary osteoarthritis, right knee: Secondary | ICD-10-CM | POA: Diagnosis not present

## 2018-06-29 DIAGNOSIS — N202 Calculus of kidney with calculus of ureter: Secondary | ICD-10-CM | POA: Diagnosis not present

## 2018-06-29 DIAGNOSIS — M545 Low back pain: Secondary | ICD-10-CM | POA: Diagnosis not present

## 2018-06-29 DIAGNOSIS — N200 Calculus of kidney: Secondary | ICD-10-CM | POA: Diagnosis not present

## 2018-08-30 DIAGNOSIS — R319 Hematuria, unspecified: Secondary | ICD-10-CM | POA: Diagnosis not present

## 2018-08-30 DIAGNOSIS — N200 Calculus of kidney: Secondary | ICD-10-CM | POA: Diagnosis not present

## 2018-09-15 DIAGNOSIS — R319 Hematuria, unspecified: Secondary | ICD-10-CM | POA: Diagnosis not present

## 2018-09-15 DIAGNOSIS — Z1389 Encounter for screening for other disorder: Secondary | ICD-10-CM | POA: Diagnosis not present

## 2018-09-15 DIAGNOSIS — N200 Calculus of kidney: Secondary | ICD-10-CM | POA: Diagnosis not present

## 2018-11-09 DIAGNOSIS — S46911A Strain of unspecified muscle, fascia and tendon at shoulder and upper arm level, right arm, initial encounter: Secondary | ICD-10-CM | POA: Diagnosis not present

## 2018-11-14 DIAGNOSIS — E041 Nontoxic single thyroid nodule: Secondary | ICD-10-CM | POA: Diagnosis not present

## 2018-11-14 DIAGNOSIS — E782 Mixed hyperlipidemia: Secondary | ICD-10-CM | POA: Diagnosis not present

## 2018-11-14 DIAGNOSIS — E039 Hypothyroidism, unspecified: Secondary | ICD-10-CM | POA: Diagnosis not present

## 2018-11-14 DIAGNOSIS — N200 Calculus of kidney: Secondary | ICD-10-CM | POA: Diagnosis not present

## 2018-11-14 DIAGNOSIS — R5382 Chronic fatigue, unspecified: Secondary | ICD-10-CM | POA: Diagnosis not present

## 2018-11-17 DIAGNOSIS — E782 Mixed hyperlipidemia: Secondary | ICD-10-CM | POA: Diagnosis not present

## 2018-11-17 DIAGNOSIS — E039 Hypothyroidism, unspecified: Secondary | ICD-10-CM | POA: Diagnosis not present

## 2018-11-17 DIAGNOSIS — E559 Vitamin D deficiency, unspecified: Secondary | ICD-10-CM | POA: Diagnosis not present

## 2018-11-17 DIAGNOSIS — E041 Nontoxic single thyroid nodule: Secondary | ICD-10-CM | POA: Diagnosis not present

## 2018-11-17 DIAGNOSIS — S46911A Strain of unspecified muscle, fascia and tendon at shoulder and upper arm level, right arm, initial encounter: Secondary | ICD-10-CM | POA: Diagnosis not present

## 2019-01-17 DIAGNOSIS — R0989 Other specified symptoms and signs involving the circulatory and respiratory systems: Secondary | ICD-10-CM | POA: Diagnosis not present

## 2019-01-17 DIAGNOSIS — J309 Allergic rhinitis, unspecified: Secondary | ICD-10-CM | POA: Diagnosis not present

## 2019-04-24 DIAGNOSIS — G4733 Obstructive sleep apnea (adult) (pediatric): Secondary | ICD-10-CM | POA: Diagnosis not present

## 2019-04-24 DIAGNOSIS — E039 Hypothyroidism, unspecified: Secondary | ICD-10-CM | POA: Diagnosis not present

## 2019-04-24 DIAGNOSIS — E782 Mixed hyperlipidemia: Secondary | ICD-10-CM | POA: Diagnosis not present

## 2019-04-24 DIAGNOSIS — E041 Nontoxic single thyroid nodule: Secondary | ICD-10-CM | POA: Diagnosis not present

## 2019-06-05 DIAGNOSIS — Z0001 Encounter for general adult medical examination with abnormal findings: Secondary | ICD-10-CM | POA: Diagnosis not present

## 2019-06-08 DIAGNOSIS — Z0001 Encounter for general adult medical examination with abnormal findings: Secondary | ICD-10-CM | POA: Diagnosis not present

## 2019-06-08 DIAGNOSIS — Z23 Encounter for immunization: Secondary | ICD-10-CM | POA: Diagnosis not present

## 2019-06-08 DIAGNOSIS — E782 Mixed hyperlipidemia: Secondary | ICD-10-CM | POA: Diagnosis not present

## 2019-06-08 DIAGNOSIS — E039 Hypothyroidism, unspecified: Secondary | ICD-10-CM | POA: Diagnosis not present

## 2019-06-08 DIAGNOSIS — R7303 Prediabetes: Secondary | ICD-10-CM | POA: Diagnosis not present

## 2019-06-23 DIAGNOSIS — R319 Hematuria, unspecified: Secondary | ICD-10-CM | POA: Diagnosis not present

## 2019-06-23 DIAGNOSIS — N200 Calculus of kidney: Secondary | ICD-10-CM | POA: Diagnosis not present

## 2019-06-28 DIAGNOSIS — K760 Fatty (change of) liver, not elsewhere classified: Secondary | ICD-10-CM | POA: Diagnosis not present

## 2019-06-28 DIAGNOSIS — R319 Hematuria, unspecified: Secondary | ICD-10-CM | POA: Diagnosis not present

## 2019-06-28 DIAGNOSIS — K5730 Diverticulosis of large intestine without perforation or abscess without bleeding: Secondary | ICD-10-CM | POA: Diagnosis not present

## 2019-06-28 DIAGNOSIS — K439 Ventral hernia without obstruction or gangrene: Secondary | ICD-10-CM | POA: Diagnosis not present

## 2019-06-28 DIAGNOSIS — N200 Calculus of kidney: Secondary | ICD-10-CM | POA: Diagnosis not present

## 2019-06-28 DIAGNOSIS — M48061 Spinal stenosis, lumbar region without neurogenic claudication: Secondary | ICD-10-CM | POA: Diagnosis not present

## 2019-09-04 DIAGNOSIS — E782 Mixed hyperlipidemia: Secondary | ICD-10-CM | POA: Diagnosis not present

## 2019-09-04 DIAGNOSIS — E039 Hypothyroidism, unspecified: Secondary | ICD-10-CM | POA: Diagnosis not present

## 2019-11-03 DIAGNOSIS — E039 Hypothyroidism, unspecified: Secondary | ICD-10-CM | POA: Diagnosis not present

## 2019-11-03 DIAGNOSIS — E7849 Other hyperlipidemia: Secondary | ICD-10-CM | POA: Diagnosis not present

## 2019-12-01 DIAGNOSIS — E782 Mixed hyperlipidemia: Secondary | ICD-10-CM | POA: Diagnosis not present

## 2019-12-01 DIAGNOSIS — N200 Calculus of kidney: Secondary | ICD-10-CM | POA: Diagnosis not present

## 2019-12-01 DIAGNOSIS — R5382 Chronic fatigue, unspecified: Secondary | ICD-10-CM | POA: Diagnosis not present

## 2019-12-01 DIAGNOSIS — R7303 Prediabetes: Secondary | ICD-10-CM | POA: Diagnosis not present

## 2019-12-01 DIAGNOSIS — E7849 Other hyperlipidemia: Secondary | ICD-10-CM | POA: Diagnosis not present

## 2019-12-01 DIAGNOSIS — E039 Hypothyroidism, unspecified: Secondary | ICD-10-CM | POA: Diagnosis not present

## 2019-12-01 DIAGNOSIS — I10 Essential (primary) hypertension: Secondary | ICD-10-CM | POA: Diagnosis not present

## 2019-12-05 DIAGNOSIS — K5730 Diverticulosis of large intestine without perforation or abscess without bleeding: Secondary | ICD-10-CM | POA: Diagnosis not present

## 2019-12-05 DIAGNOSIS — R319 Hematuria, unspecified: Secondary | ICD-10-CM | POA: Diagnosis not present

## 2019-12-05 DIAGNOSIS — Z0001 Encounter for general adult medical examination with abnormal findings: Secondary | ICD-10-CM | POA: Diagnosis not present

## 2019-12-05 DIAGNOSIS — R7303 Prediabetes: Secondary | ICD-10-CM | POA: Diagnosis not present

## 2019-12-05 DIAGNOSIS — E559 Vitamin D deficiency, unspecified: Secondary | ICD-10-CM | POA: Diagnosis not present

## 2020-01-03 DIAGNOSIS — E7849 Other hyperlipidemia: Secondary | ICD-10-CM | POA: Diagnosis not present

## 2020-01-03 DIAGNOSIS — I10 Essential (primary) hypertension: Secondary | ICD-10-CM | POA: Diagnosis not present

## 2020-01-19 DIAGNOSIS — G4733 Obstructive sleep apnea (adult) (pediatric): Secondary | ICD-10-CM | POA: Diagnosis not present

## 2020-02-08 DIAGNOSIS — R350 Frequency of micturition: Secondary | ICD-10-CM | POA: Diagnosis not present

## 2020-02-08 DIAGNOSIS — R300 Dysuria: Secondary | ICD-10-CM | POA: Diagnosis not present

## 2020-02-08 DIAGNOSIS — R3911 Hesitancy of micturition: Secondary | ICD-10-CM | POA: Diagnosis not present

## 2020-02-14 DIAGNOSIS — E041 Nontoxic single thyroid nodule: Secondary | ICD-10-CM | POA: Diagnosis not present

## 2020-03-04 DIAGNOSIS — E039 Hypothyroidism, unspecified: Secondary | ICD-10-CM | POA: Diagnosis not present

## 2020-03-04 DIAGNOSIS — E7849 Other hyperlipidemia: Secondary | ICD-10-CM | POA: Diagnosis not present

## 2020-03-04 DIAGNOSIS — I10 Essential (primary) hypertension: Secondary | ICD-10-CM | POA: Diagnosis not present

## 2020-03-04 DIAGNOSIS — R7303 Prediabetes: Secondary | ICD-10-CM | POA: Diagnosis not present

## 2020-03-06 DIAGNOSIS — E039 Hypothyroidism, unspecified: Secondary | ICD-10-CM | POA: Diagnosis not present

## 2020-03-06 DIAGNOSIS — R319 Hematuria, unspecified: Secondary | ICD-10-CM | POA: Diagnosis not present

## 2020-03-06 DIAGNOSIS — E041 Nontoxic single thyroid nodule: Secondary | ICD-10-CM | POA: Diagnosis not present

## 2020-03-18 DIAGNOSIS — E041 Nontoxic single thyroid nodule: Secondary | ICD-10-CM | POA: Diagnosis not present

## 2020-03-23 DIAGNOSIS — S40862A Insect bite (nonvenomous) of left upper arm, initial encounter: Secondary | ICD-10-CM | POA: Diagnosis not present

## 2020-04-03 DIAGNOSIS — R7303 Prediabetes: Secondary | ICD-10-CM | POA: Diagnosis not present

## 2020-04-03 DIAGNOSIS — I10 Essential (primary) hypertension: Secondary | ICD-10-CM | POA: Diagnosis not present

## 2020-04-03 DIAGNOSIS — E039 Hypothyroidism, unspecified: Secondary | ICD-10-CM | POA: Diagnosis not present

## 2020-04-03 DIAGNOSIS — E7849 Other hyperlipidemia: Secondary | ICD-10-CM | POA: Diagnosis not present

## 2020-04-29 ENCOUNTER — Ambulatory Visit (INDEPENDENT_AMBULATORY_CARE_PROVIDER_SITE_OTHER): Payer: Medicare Other | Admitting: Urology

## 2020-04-29 ENCOUNTER — Other Ambulatory Visit: Payer: Self-pay

## 2020-04-29 ENCOUNTER — Encounter: Payer: Self-pay | Admitting: Urology

## 2020-04-29 VITALS — BP 144/79 | HR 66 | Temp 97.8°F | Ht 67.0 in | Wt 240.0 lb

## 2020-04-29 DIAGNOSIS — R310 Gross hematuria: Secondary | ICD-10-CM | POA: Diagnosis not present

## 2020-04-29 DIAGNOSIS — R3129 Other microscopic hematuria: Secondary | ICD-10-CM

## 2020-04-29 NOTE — Progress Notes (Signed)

## 2020-04-29 NOTE — Progress Notes (Signed)
04/29/2020 10:49 AM   Darrell Rivas 1951/03/14 [id number]  Referring provider: Curlene Labrum, MD [place],  [place] [id number]  Gross hematuria  HPI: Darrell Rivas is a 69yo here for evaluation of gross hematuria. 6 weeks ago he had gross painless hematuria and was seen in Dr. Iverson Alamin office and was treated with doxycycline. He has a hx of uric acid calculi starting 12 years ago. Last stone event was 6 months ago. No tobacco abuse hx. He was a transfer truck Geophysicist/field seismologist.    PMH: Past Medical History:  Diagnosis Date  . Hypothyroidism   . Kidney stones   . Sleep apnea   . Thyroid disease     Surgical History: History reviewed. No pertinent surgical history.  Home Medications:  Allergies as of 04/29/2020   No Known Allergies     Medication List       Accurate as of April 29, 2020 10:49 AM. If you have any questions, ask your nurse or doctor.        levothyroxine 150 MCG tablet Commonly known as: SYNTHROID Take 1 tablet (150 mcg total) by mouth daily before breakfast. What changed: how much to take   Red Yeast Rice 600 MG Tabs Take 1 tablet (600 mg total) by mouth daily.       Allergies: No Known Allergies  Family History: Family History  Problem Relation Age of Onset  . Cancer Mother   . Stroke Father   . Cancer Sister   . Diabetes Maternal Grandmother     Social History:  reports that he has never smoked. He has never used smokeless tobacco. He reports that he does not drink alcohol. No history on file for drug use.  ROS: All other review of systems were reviewed and are negative except what is noted above in HPI  Physical Exam: BP (!) 144/79   Pulse 66   Temp 97.8 F (36.6 C)   Ht 5\' 7"  (1.702 m)   Wt (!) 240 lb (108.9 kg)   BMI 37.59 kg/m   Constitutional:  Alert and oriented, No acute distress. HEENT: [place] AT, moist mucus membranes.  Trachea midline, no masses. Cardiovascular: No clubbing, cyanosis, or edema. Respiratory: Normal  respiratory effort, no increased work of breathing. GI: Abdomen is soft, nontender, nondistended, no abdominal masses GU: No CVA tenderness. Circumcised phallus. No masses/lesions on penis, testis, scrotum. Prostate 40g smooth no nodules no induration.  Lymph: No cervical or inguinal lymphadenopathy. Skin: No rashes, bruises or suspicious lesions. Neurologic: Grossly intact, no focal deficits, moving all 4 extremities. Psychiatric: Normal mood and affect.  Laboratory Data: No results found for: WBC, HGB, HCT, MCV, PLT  No results found for: CREATININE  No results found for: PSA  No results found for: TESTOSTERONE  No results found for: HGBA1C  Urinalysis No results found for: COLORURINE, APPEARANCEUR, LABSPEC, PHURINE, GLUCOSEU, HGBUR, BILIRUBINUR, KETONESUR, PROTEINUR, UROBILINOGEN, NITRITE, LEUKOCYTESUR  No results found for: LABMICR, [place], RBCUA, LABEPIT, MUCUS, BACTERIA  Pertinent Imaging:  No results found for this or any previous visit.  No results found for this or any previous visit.  No results found for this or any previous visit.  No results found for this or any previous visit.  No results found for this or any previous visit.  No results found for this or any previous visit.  No results found for this or any previous visit.  No results found for this or any previous visit.   Assessment &  Plan:    1. Gross hematuria BMP -CT hematuria -office cystoscopy - Urinalysis, Routine w reflex microscopic; Future    No follow-ups on file.  Nicolette Bang, MD  [facility] [place]

## 2020-04-29 NOTE — Patient Instructions (Signed)
Hematuria, Adult Hematuria is blood in the urine. Blood may be visible in the urine, or it may be identified with a test. This condition can be caused by infections of the bladder, urethra, kidney, or prostate. Other possible causes include:  Kidney stones.  Cancer of the urinary tract.  Too much calcium in the urine.  Conditions that are passed from parent to child (inherited conditions).  Exercise that requires a lot of energy. Infections can usually be treated with medicine, and a kidney stone usually will pass through your urine. If neither of these is the cause of your hematuria, more tests may be needed to identify the cause of your symptoms. It is very important to tell your health care provider about any blood in your urine, even if it is painless or the blood stops without treatment. Blood in the urine, when it happens and then stops and then happens again, can be a symptom of a very serious condition, including cancer. There is no pain in the initial stages of many urinary cancers. Follow these instructions at home: Medicines  Take over-the-counter and prescription medicines only as told by your health care provider.  If you were prescribed an antibiotic medicine, take it as told by your health care provider. Do not stop taking the antibiotic even if you start to feel better. Eating and drinking  Drink enough fluid to keep your urine clear or pale yellow. It is recommended that you drink 3-4 quarts (2.8-3.8 L) a day. If you have been diagnosed with an infection, it is recommended that you drink cranberry juice in addition to large amounts of water.  Avoid caffeine, tea, and carbonated beverages. These tend to irritate the bladder.  Avoid alcohol because it may irritate the prostate (men). General instructions  If you have been diagnosed with a kidney stone, follow your health care provider's instructions about straining your urine to catch the stone.  Empty your bladder  often. Avoid holding urine for long periods of time.  If you are male: ? After a bowel movement, wipe from front to back and use each piece of toilet paper only once. ? Empty your bladder before and after sex.  Pay attention to any changes in your symptoms. Tell your health care provider about any changes or any new symptoms.  It is your responsibility to get your test results. Ask your health care provider, or the department performing the test, when your results will be ready.  Keep all follow-up visits as told by your health care provider. This is important. Contact a health care provider if:  You develop back pain.  You have a fever.  You have nausea or vomiting.  Your symptoms do not improve after 3 days.  Your symptoms get worse. Get help right away if:  You develop severe vomiting and are unable take medicine without vomiting.  You develop severe pain in your back or abdomen even though you are taking medicine.  You pass a large amount of blood in your urine.  You pass blood clots in your urine.  You feel very weak or like you might faint.  You faint. Summary  Hematuria is blood in the urine. It has many possible causes.  It is very important that you tell your health care provider about any blood in your urine, even if it is painless or the blood stops without treatment.  Take over-the-counter and prescription medicines only as told by your health care provider.  Drink enough fluid to keep   your urine clear or pale yellow. This information is not intended to replace advice given to you by your health care provider. Make sure you discuss any questions you have with your health care provider. Document Revised: 02/15/2019 Document Reviewed: 10/24/2016 Elsevier Patient Education  2020 [employer]  

## 2020-04-30 LAB — BASIC METABOLIC PANEL
BUN/Creatinine Ratio: 19 (ref 10–24)
BUN: 18 mg/dL (ref 8–27)
CO2: 25 mmol/L (ref 20–29)
Calcium: 8.9 mg/dL (ref 8.6–10.2)
Chloride: 103 mmol/L (ref 96–106)
Creatinine, Ser: 0.94 mg/dL (ref 0.76–1.27)
GFR calc Af Amer: 95 mL/min/{1.73_m2} (ref >59)
GFR calc non Af Amer: 82 mL/min/{1.73_m2} (ref >59)
Glucose: 126 mg/dL — ABNORMAL HIGH (ref 65–99)
Potassium: 4.5 mmol/L (ref 3.5–5.2)
Sodium: 140 mmol/L (ref 134–144)

## 2020-04-30 NOTE — Progress Notes (Signed)
Labs mailed

## 2020-05-13 DIAGNOSIS — G4733 Obstructive sleep apnea (adult) (pediatric): Secondary | ICD-10-CM | POA: Diagnosis not present

## 2020-05-20 ENCOUNTER — Ambulatory Visit (HOSPITAL_COMMUNITY)
Admission: RE | Admit: 2020-05-20 | Discharge: 2020-05-20 | Disposition: A | Discharge status: Home / Self Care | Payer: Medicare Other | Source: Ambulatory Visit | Attending: Urology | Admitting: Urology

## 2020-05-20 ENCOUNTER — Other Ambulatory Visit: Payer: Self-pay

## 2020-05-20 DIAGNOSIS — N200 Calculus of kidney: Secondary | ICD-10-CM | POA: Diagnosis not present

## 2020-05-20 DIAGNOSIS — R310 Gross hematuria: Secondary | ICD-10-CM | POA: Insufficient documentation

## 2020-05-20 DIAGNOSIS — I700 Atherosclerosis of aorta: Secondary | ICD-10-CM | POA: Diagnosis not present

## 2020-05-20 DIAGNOSIS — K5730 Diverticulosis of large intestine without perforation or abscess without bleeding: Secondary | ICD-10-CM | POA: Diagnosis not present

## 2020-05-20 DIAGNOSIS — K429 Umbilical hernia without obstruction or gangrene: Secondary | ICD-10-CM | POA: Diagnosis not present

## 2020-05-20 MED ORDER — IOHEXOL 300 MG/ML  SOLN
150.0000 mL | Freq: Once | INTRAMUSCULAR | Status: AC | PRN
  Administered 2020-05-20: 125 mL via INTRAVENOUS

## 2020-05-22 ENCOUNTER — Telehealth: Payer: Self-pay

## 2020-05-22 NOTE — Telephone Encounter (Signed)
-----   Message from Cleon Gustin, MD sent at 05/22/2020  7:32 AM EDT ----- Patient needs to see me for cystoscopy. Ct shows bladder mass ----- Message ----- From: Dorisann Frames, RN Sent: 05/20/2020   4:25 PM EDT To: Cleon Gustin, MD  Please review

## 2020-05-22 NOTE — Telephone Encounter (Signed)
Pt called and notified

## 2020-05-23 DIAGNOSIS — G4733 Obstructive sleep apnea (adult) (pediatric): Secondary | ICD-10-CM | POA: Diagnosis not present

## 2020-06-05 ENCOUNTER — Ambulatory Visit (INDEPENDENT_AMBULATORY_CARE_PROVIDER_SITE_OTHER): Payer: Medicare Other | Admitting: Urology

## 2020-06-05 ENCOUNTER — Other Ambulatory Visit: Payer: Self-pay

## 2020-06-05 VITALS — BP 148/85 | HR 84 | Temp 98.0°F | Ht 67.0 in | Wt 240.0 lb

## 2020-06-05 DIAGNOSIS — R310 Gross hematuria: Secondary | ICD-10-CM

## 2020-06-05 LAB — URINALYSIS, ROUTINE W REFLEX MICROSCOPIC
Bilirubin, UA: NEGATIVE
Ketones, UA: NEGATIVE
Leukocytes,UA: NEGATIVE
Nitrite, UA: NEGATIVE
Protein,UA: NEGATIVE
RBC, UA: NEGATIVE
Specific Gravity, UA: 1.025 (ref 1.005–1.030)
Urobilinogen, Ur: 0.2 mg/dL (ref 0.2–1.0)
pH, UA: 5 (ref 5.0–7.5)

## 2020-06-05 MED ORDER — CIPROFLOXACIN HCL 500 MG PO TABS
500.0000 mg | ORAL_TABLET | Freq: Once | ORAL | Status: AC
  Administered 2020-06-05: 500 mg via ORAL

## 2020-06-05 NOTE — Progress Notes (Signed)
06/05/2020 12:30 PM   Darrell Rivas 1951-06-30 [id number]  Referring provider: Curlene Labrum, MD [place],  [place] [address]  microhematuria  HPI: Darrell Rivas is a 69yo here for followup for microhematuria. He underwent CT hematuria protocol which showed left lower pole calculi and a concern for a tumor in the bladder. No denies any LUTS. He is retired Engineer, building services.    PMH: Past Medical History:  Diagnosis Date  . Hypothyroidism   . Kidney stones   . Sleep apnea   . Thyroid disease     Surgical History: No past surgical history on file.  Home Medications:  Allergies as of 06/05/2020   No Known Allergies     Medication List       Accurate as of June 05, 2020 12:30 PM. If you have any questions, ask your nurse or doctor.        levothyroxine 150 MCG tablet Commonly known as: SYNTHROID Take 1 tablet (150 mcg total) by mouth daily before breakfast. What changed: how much to take   Euthyrox 125 MCG tablet Generic drug: levothyroxine Take 125 mcg by mouth daily. What changed: Another medication with the same name was changed. Make sure you understand how and when to take each.   Red Yeast Rice 600 MG Tabs Take 1 tablet (600 mg total) by mouth daily.       Allergies: No Known Allergies  Family History: Family History  Problem Relation Age of Onset  . Cancer Mother   . Stroke Father   . Cancer Sister   . Diabetes Maternal Grandmother     Social History:  reports that he has never smoked. He has never used smokeless tobacco. He reports that he does not drink alcohol. No history on file for drug use.  ROS: All other review of systems were reviewed and are negative except what is noted above in HPI  Physical Exam: BP (!) 148/85   Pulse 84   Temp 98 F (36.7 C)   Ht 5\' 7"  (1.702 m)   Wt 240 lb (108.9 kg)   BMI 37.59 kg/m   Constitutional:  Alert and oriented, No acute distress. HEENT: [place] AT, moist mucus membranes.  Trachea midline,  no masses. Cardiovascular: No clubbing, cyanosis, or edema. Respiratory: Normal respiratory effort, no increased work of breathing. GI: Abdomen is soft, nontender, nondistended, no abdominal masses GU: No CVA tenderness.  Lymph: No cervical or inguinal lymphadenopathy. Skin: No rashes, bruises or suspicious lesions. Neurologic: Grossly intact, no focal deficits, moving all 4 extremities. Psychiatric: Normal mood and affect.  Laboratory Data: No results found for: WBC, HGB, HCT, MCV, PLT  Lab Results  Component Value Date   CREATININE 0.94 04/29/2020    No results found for: PSA  No results found for: TESTOSTERONE  No results found for: HGBA1C  Urinalysis    Component Value Date/Time   APPEARANCEUR Clear 06/05/2020 1127   GLUCOSEU Trace (A) 06/05/2020 1127   BILIRUBINUR Negative 06/05/2020 1127   PROTEINUR Negative 06/05/2020 1127   NITRITE Negative 06/05/2020 1127   LEUKOCYTESUR Negative 06/05/2020 1127    Lab Results  Component Value Date   LABMICR Comment 06/05/2020    Pertinent Imaging: CT hematuria 05/20/2020: Images reviewed and discussed with the patient No results found for this or any previous visit.  No results found for this or any previous visit.  No results found for this or any previous visit.  No results found for this or any  previous visit.  No results found for this or any previous visit.  No results found for this or any previous visit.  Results for orders placed during the hospital encounter of 05/20/20  CT HEMATURIA WORKUP  Narrative CLINICAL DATA:  Gross hematuria. Began over 6 weeks ago with history of uric acid calculi.  EXAM: CT ABDOMEN AND PELVIS WITHOUT AND WITH CONTRAST  TECHNIQUE: Multidetector CT imaging of the abdomen and pelvis was performed following the standard protocol before and following the bolus administration of intravenous contrast.  CONTRAST:  141mL OMNIPAQUE IOHEXOL 300 MG/ML  SOLN  COMPARISON:  June 28, 2019  FINDINGS: Lower chest: Lung bases are clear.  Hepatobiliary: Low-density hepatic parenchyma compatible with steatosis, lobular hepatic contours without focal, suspicious hepatic lesion. Portal vein is patent. Hepatic veins are patent.  Pancreas: Pancreas is normal.  Spleen: Granulomatous changes in the normal size spleen.  Adrenals/Urinary Tract: Adrenal glands are normal.  Nephrolithiasis on the LEFT 3-4 mm calculus in the lower pole LEFT kidney. No ureteral calculi. Urinary bladder is under distended limiting assessment. There is some nodularity however at the LEFT bladder base this is near the LEFT ureterovesicular orifice best seen on images 80 of series 3 and image 89 of series 11.  Also seen on delayed phase imaging is a small nodular filling defect (image 80, series 4), additionally seen on coronal dataset (image 86, series 15)  Stomach/Bowel: No acute gastrointestinal process. Colonic diverticulosis. Stable appearance of central mesenteric stranding and scattered small lymph nodes in the small bowel mesentery.  Vascular/Lymphatic: Normal caliber abdominal aorta no adenopathy in the retroperitoneum or in the upper abdomen. No pelvic adenopathy.  Reproductive: Prostate with mild heterogeneity. No pelvic adenopathy.  Other: Small fat containing umbilical hernia.  Musculoskeletal: No acute bone finding. No destructive bone process. Spinal degenerative changes.  IMPRESSION: 1. Nodular filling defect at the LEFT bladder base with perceived enhancement, suspicious for small urothelial neoplasm. Cystoscopy is suggested for further evaluation. 2. Nephrolithiasis on the LEFT. 3. Hepatic steatosis. 4. Colonic diverticulosis. 5. Small fat containing umbilical hernia. 6. Aortic atherosclerosis.  These results will be called to the ordering clinician or representative by the Radiologist Assistant, and communication documented in the PACS or [employer].  Aortic Atherosclerosis (ICD10-I70.0).   Electronically Signed By: Zetta Bills M.D. On: 05/20/2020 16:20  No results found for this or any previous visit.     Cystoscopy Procedure Note  Patient identification was confirmed, informed consent was obtained, and patient was prepped using Betadine solution.  Lidocaine jelly was administered per urethral meatus.     Pre-Procedure: - Inspection reveals a normal caliber ureteral meatus.  Procedure: The flexible cystoscope was introduced without difficulty - No urethral strictures/lesions are present. - Enlarged prostate  - Normal bladder neck - Bilateral ureteral orifices identified - 1cm papillary tumor above the left ureteral orifice - No bladder stones - No trabeculation  Retroflexion shows no intravesical prostatic protrusion   Post-Procedure: - Patient tolerated the procedure well   Assessment & Plan:    1. Micro hematuria -Likely related to bladder tumor. We discussed the management including transurethral resection and the patient wishes to proceed with surgery. Risks/benefits/alternatives discussed - Urinalysis, Routine w reflex microscopic - ciprofloxacin (CIPRO) tablet 500 mg   No follow-ups on file.  Nicolette Bang, MD  [facility] [place]

## 2020-06-05 NOTE — Progress Notes (Signed)
Urological Symptom Review  Patient is experiencing the following symptoms: Blood in urine   Review of Systems  Gastrointestinal (upper)  : Negative for upper GI symptoms  Gastrointestinal (lower) : Negative for lower GI symptoms  Constitutional : Negative for symptoms  Skin: Negative for skin symptoms  Eyes: Negative for eye symptoms  Ear/Nose/Throat : Negative for Ear/Nose/Throat symptoms  Hematologic/Lymphatic: Negative for Hematologic/Lymphatic symptoms  Cardiovascular : Negative for cardiovascular symptoms  Respiratory : Negative for respiratory symptoms  Endocrine: Negative for endocrine symptoms  Musculoskeletal: Negative for musculoskeletal symptoms  Neurological: Negative for neurological symptoms  Psychologic: Negative for psychiatric symptoms  

## 2020-06-06 ENCOUNTER — Encounter: Payer: Self-pay | Admitting: Urology

## 2020-06-06 NOTE — Patient Instructions (Signed)
Bladder Cancer  Bladder cancer is an abnormal growth of tissue in the bladder. The bladder is the balloon-like sac in the pelvis. It collects and stores urine that comes from the kidneys through the ureters. The bladder wall is made of layers. If cancer spreads into these layers and through the wall of the bladder, it becomes more difficult to treat. What are the causes? The cause of this condition is not known. What increases the risk? The following factors may make you more likely to develop this condition:  Smoking.  Workplace risks (occupational exposures), such as rubber, leather, textile, dyes, chemicals, and paint.  Being white.  Your age. Most people with bladder cancer are over the age of 55.  Being male.  Having chronic bladder inflammation.  Having a personal history of bladder cancer.  Having a family history of bladder cancer (heredity).  Having had chemotherapy or radiation therapy to the pelvis.  Having been exposed to arsenic. What are the signs or symptoms? Initial symptoms of this condition include:  Blood in the urine.  Painful urination.  Frequent bladder or urine infections.  Increase in urgency and frequency of urination. Advanced symptoms of this condition include:  Not being able to urinate.  Low back pain on one side.  Loss of appetite.  Weight loss.  Fatigue.  Swelling in the feet.  Bone pain. How is this diagnosed? This condition is diagnosed based on your medical history, a physical exam, urine tests, lab tests, imaging tests, and your symptoms. You may also have other tests or procedures done, such as:  A narrow tube being inserted into your bladder through your urethra (cystoscopy) in order to view the lining of your bladder for tumors.  A biopsy to sample the tumor to see if cancer is present. If cancer is present, it will then be staged to determine its severity and extent. Staging is an assessment of:  The size of the  tumor.  Whether the cancer has spread.  Where the cancer has spread. It is important to know how deeply into the bladder wall cancer has grown and whether cancer has spread to any other parts of your body. Staging may require blood tests or imaging tests, such as a CT scan, MRI, bone scan, or chest X-ray. How is this treated? Based on the stage of cancer, one treatment or a combination of treatments may be recommended. The most common forms of treatment are:  Surgery to remove the cancer. Procedures that may be done include transurethral resection and cystectomy.  Radiation therapy. This is high-energy X-rays or other particles. This is often used in combination with chemotherapy.  Chemotherapy. During this treatment, medicines are used to kill cancer cells.  Immunotherapy. This uses medicines to help your own immune system destroy cancer cells. Follow these instructions at home:  Take over-the-counter and prescription medicines only as told by your health care provider.  Maintain a healthy diet. Some of your treatments might affect your appetite.  Consider joining a support group. This may help you learn to cope with the stress of having bladder cancer.  Tell your cancer care team if you develop side effects. They may be able to recommend ways to relieve them.  Keep all follow-up visits as told by your health care provider. This is important. Where to find more information  American Cancer Society: www.cancer.org  National Cancer Institute (NCI): www.cancer.gov Contact a health care provider if:  You have symptoms of a urinary tract infection. These include: ?   Fever. ? Chills. ? Weakness. ? Muscle aches. ? Abdominal pain. ? Frequent and intense urge to urinate. ? Burning feeling in the bladder or urethra during urination. Get help right away if:  There is blood in your urine.  You cannot urinate.  You have severe pain or other symptoms that do not go  away. Summary  Bladder cancer is an abnormal growth of tissue in the bladder.  This condition is diagnosed based on your medical history, a physical exam, urine tests, lab tests, imaging tests, and your symptoms.  Based on the stage of cancer, surgery, chemotherapy, or a combination of treatments may be recommended.  Consider joining a support group. This may help you learn to cope with the stress of having bladder cancer. This information is not intended to replace advice given to you by your health care provider. Make sure you discuss any questions you have with your health care provider. Document Revised: 09/03/2017 Document Reviewed: 08/25/2016 Elsevier Patient Education  2020 [employer]  

## 2020-06-24 DIAGNOSIS — E041 Nontoxic single thyroid nodule: Secondary | ICD-10-CM | POA: Diagnosis not present

## 2020-06-24 DIAGNOSIS — Z1159 Encounter for screening for other viral diseases: Secondary | ICD-10-CM | POA: Diagnosis not present

## 2020-06-24 DIAGNOSIS — E782 Mixed hyperlipidemia: Secondary | ICD-10-CM | POA: Diagnosis not present

## 2020-06-24 DIAGNOSIS — R5382 Chronic fatigue, unspecified: Secondary | ICD-10-CM | POA: Diagnosis not present

## 2020-06-24 DIAGNOSIS — R739 Hyperglycemia, unspecified: Secondary | ICD-10-CM | POA: Diagnosis not present

## 2020-06-24 DIAGNOSIS — K760 Fatty (change of) liver, not elsewhere classified: Secondary | ICD-10-CM | POA: Diagnosis not present

## 2020-06-24 DIAGNOSIS — E039 Hypothyroidism, unspecified: Secondary | ICD-10-CM | POA: Diagnosis not present

## 2020-06-27 DIAGNOSIS — Z0001 Encounter for general adult medical examination with abnormal findings: Secondary | ICD-10-CM | POA: Diagnosis not present

## 2020-06-27 DIAGNOSIS — D494 Neoplasm of unspecified behavior of bladder: Secondary | ICD-10-CM | POA: Diagnosis not present

## 2020-06-27 DIAGNOSIS — I700 Atherosclerosis of aorta: Secondary | ICD-10-CM | POA: Diagnosis not present

## 2020-06-27 DIAGNOSIS — E782 Mixed hyperlipidemia: Secondary | ICD-10-CM | POA: Diagnosis not present

## 2020-06-27 DIAGNOSIS — Z23 Encounter for immunization: Secondary | ICD-10-CM | POA: Diagnosis not present

## 2020-06-27 DIAGNOSIS — E041 Nontoxic single thyroid nodule: Secondary | ICD-10-CM | POA: Diagnosis not present

## 2020-06-28 NOTE — Patient Instructions (Signed)
CAYLAN SCHIFANO  06/28/2020     @PREFPERIOPPHARMACY @   Your procedure is scheduled on  07/08/2020.  Report to [facility] at  1230  P.M.  Call this number if you have problems the morning of surgery:  [phone]   Remember:  Do not eat or drink after midnight.                        Take these medicines the morning of surgery with A SIP OF WATER  Euthyrox, levothyroxine.    Do not wear jewelry, make-up or nail polish.  Do not wear lotions, powders, or perfumes. Please wear deodorant and brush your teeth.  Do not shave 48 hours prior to surgery.  Men may shave face and neck.  Do not bring valuables to the hospital.  [facility] is not responsible for any belongings or valuables.  Contacts, dentures or bridgework may not be worn into surgery.  Leave your suitcase in the car.  After surgery it may be brought to your room.  For patients admitted to the hospital, discharge time will be determined by your treatment team.  Patients discharged the day of surgery will not be allowed to drive home.   Name and phone number of your driver:   family Special instructions:   DO NOT smoke the morning of your procedure.  Please read over the following fact sheets that you were given. Anesthesia Post-op Instructions and Care and Recovery After Surgery       Transurethral Resection of Bladder Tumor, Care After This sheet gives you information about how to care for yourself after your procedure. Your health care provider may also give you more specific instructions. If you have problems or questions, contact your health care provider. What can I expect after the procedure? After the procedure, it is common to have:  A small amount of blood in your urine for up to 2 weeks.  Soreness or mild pain from your catheter. After your catheter is removed, you may have mild soreness, especially when urinating.  Pain in your lower abdomen. Follow these instructions at  home: Medicines   Take over-the-counter and prescription medicines only as told by your health care provider.  If you were prescribed an antibiotic medicine, take it as told by your health care provider. Do not stop taking the antibiotic even if you start to feel better.  Do not drive for 24 hours if you were given a sedative during your procedure.  Ask your health care provider if the medicine prescribed to you: ? Requires you to avoid driving or using heavy machinery. ? Can cause constipation. You may need to take these actions to prevent or treat constipation:  Take over-the-counter or prescription medicines.  Eat foods that are high in fiber, such as beans, whole grains, and fresh fruits and vegetables.  Limit foods that are high in fat and processed sugars, such as fried or sweet foods. Activity  Return to your normal activities as told by your health care provider. Ask your health care provider what activities are safe for you.  Do not lift anything that is heavier than 10 lb (4.5 kg), or the limit that you are told, until your health care provider says that it is safe.  Avoid intense physical activity for as long as told by your health care provider.  Rest as told by your health care provider.  Avoid sitting for a long time  without moving. Get up to take short walks every 1-2 hours. This is important to improve blood flow and breathing. Ask for help if you feel weak or unsteady. General instructions   Do not drink alcohol for as long as told by your health care provider. This is especially important if you are taking prescription pain medicines.  Do not take baths, swim, or use a hot tub until your health care provider approves. Ask your health care provider if you may take showers. You may only be allowed to take sponge baths.  If you have a catheter, follow instructions from your health care provider about caring for your catheter and your drainage bag.  Drink enough  fluid to keep your urine pale yellow.  Wear compression stockings as told by your health care provider. These stockings help to prevent blood clots and reduce swelling in your legs.  Keep all follow-up visits as told by your health care provider. This is important. ? You will need to be followed closely with regular checks of your bladder and urethra (cystoscopies) to make sure that the cancer does not come back. Contact a health care provider if:  You have pain that gets worse or does not improve with medicine.  You have blood in your urine for more than 2 weeks.  You have cloudy or bad-smelling urine.  You become constipated. Signs of constipation may include having: ? Fewer than three bowel movements in a week. ? Difficulty having a bowel movement. ? Stools that are dry, hard, or larger than normal.  You have a fever. Get help right away if:  You have: ? Severe pain. ? Bright red blood in your urine. ? Blood clots in your urine. ? A lot of blood in your urine.  Your catheter has been removed and you are not able to urinate.  You have a catheter in place and the catheter is not draining urine. Summary  After your procedure, it is common to have a small amount of blood in your urine, soreness or mild pain from your catheter, and pain in your lower abdomen.  Take over-the-counter and prescription medicines only as told by your health care provider.  Rest as told by your health care provider. Follow your health care provider's instructions about returning to normal activities. Ask what activities are safe for you.  If you have a catheter, follow instructions from your health care provider about caring for your catheter and your drainage bag.  Get help right away if you cannot urinate, you have severe pain, or you have bright red blood or blood clots in your urine. This information is not intended to replace advice given to you by your health care provider. Make sure you  discuss any questions you have with your health care provider. Document Revised: 04/21/2018 Document Reviewed: 04/21/2018 Elsevier Patient Education  [place]. How to Use Chlorhexidine for Bathing Chlorhexidine gluconate (CHG) is a germ-killing (antiseptic) solution that is used to clean the skin. It can get rid of the bacteria that normally live on the skin and can keep them away for about 24 hours. To clean your skin with CHG, you may be given:  A CHG solution to use in the shower or as part of a sponge bath.  A prepackaged cloth that contains CHG. Cleaning your skin with CHG may help lower the risk for infection:  While you are staying in the intensive care unit of the hospital.  If you have a vascular access,  such as a central line, to provide short-term or long-term access to your veins.  If you have a catheter to drain urine from your bladder.  If you are on a ventilator. A ventilator is a machine that helps you breathe by moving air in and out of your lungs.  After surgery. What are the risks? Risks of using CHG include:  A skin reaction.  Hearing loss, if CHG gets in your ears.  Eye injury, if CHG gets in your eyes and is not rinsed out.  The CHG product catching fire. Make sure that you avoid smoking and flames after applying CHG to your skin. Do not use CHG:  If you have a chlorhexidine allergy or have previously reacted to chlorhexidine.  On babies younger than 44 months of age. How to use CHG solution  Use CHG only as told by your health care provider, and follow the instructions on the label.  Use the full amount of CHG as directed. Usually, this is one bottle. During a shower Follow these steps when using CHG solution during a shower (unless your health care provider gives you different instructions): 1. Start the shower. 2. Use your normal soap and shampoo to wash your face and hair. 3. Turn off the shower or move out of the shower stream. 4. Pour  the CHG onto a clean washcloth. Do not use any type of brush or rough-edged sponge. 5. Starting at your neck, lather your body down to your toes. Make sure you follow these instructions: ? If you will be having surgery, pay special attention to the part of your body where you will be having surgery. Scrub this area for at least 1 minute. ? Do not use CHG on your head or face. If the solution gets into your ears or eyes, rinse them well with water. ? Avoid your genital area. ? Avoid any areas of skin that have broken skin, cuts, or scrapes. ? Scrub your back and under your arms. Make sure to wash skin folds. 6. Let the lather sit on your skin for 1-2 minutes or as long as told by your health care provider. 7. Thoroughly rinse your entire body in the shower. Make sure that all body creases and crevices are rinsed well. 8. Dry off with a clean towel. Do not put any substances on your body afterward--such as powder, lotion, or perfume--unless you are told to do so by your health care provider. Only use lotions that are recommended by the manufacturer. 9. Put on clean clothes or pajamas. 10. If it is the night before your surgery, sleep in clean sheets.  During a sponge bath Follow these steps when using CHG solution during a sponge bath (unless your health care provider gives you different instructions): 1. Use your normal soap and shampoo to wash your face and hair. 2. Pour the CHG onto a clean washcloth. 3. Starting at your neck, lather your body down to your toes. Make sure you follow these instructions: ? If you will be having surgery, pay special attention to the part of your body where you will be having surgery. Scrub this area for at least 1 minute. ? Do not use CHG on your head or face. If the solution gets into your ears or eyes, rinse them well with water. ? Avoid your genital area. ? Avoid any areas of skin that have broken skin, cuts, or scrapes. ? Scrub your back and under your arms.  Make sure to wash skin folds.  4. Let the lather sit on your skin for 1-2 minutes or as long as told by your health care provider. 5. Using a different clean, wet washcloth, thoroughly rinse your entire body. Make sure that all body creases and crevices are rinsed well. 6. Dry off with a clean towel. Do not put any substances on your body afterward--such as powder, lotion, or perfume--unless you are told to do so by your health care provider. Only use lotions that are recommended by the manufacturer. 7. Put on clean clothes or pajamas. 8. If it is the night before your surgery, sleep in clean sheets. How to use CHG prepackaged cloths  Only use CHG cloths as told by your health care provider, and follow the instructions on the label.  Use the CHG cloth on clean, dry skin.  Do not use the CHG cloth on your head or face unless your health care provider tells you to.  When washing with the CHG cloth: ? Avoid your genital area. ? Avoid any areas of skin that have broken skin, cuts, or scrapes. Before surgery Follow these steps when using a CHG cloth to clean before surgery (unless your health care provider gives you different instructions): 1. Using the CHG cloth, vigorously scrub the part of your body where you will be having surgery. Scrub using a back-and-forth motion for 3 minutes. The area on your body should be completely wet with CHG when you are done scrubbing. 2. Do not rinse. Discard the cloth and let the area air-dry. Do not put any substances on the area afterward, such as powder, lotion, or perfume. 3. Put on clean clothes or pajamas. 4. If it is the night before your surgery, sleep in clean sheets.  For general bathing Follow these steps when using CHG cloths for general bathing (unless your health care provider gives you different instructions). 1. Use a separate CHG cloth for each area of your body. Make sure you wash between any folds of skin and between your fingers and toes.  Wash your body in the following order, switching to a new cloth after each step: ? The front of your neck, shoulders, and chest. ? Both of your arms, under your arms, and your hands. ? Your stomach and groin area, avoiding the genitals. ? Your right leg and foot. ? Your left leg and foot. ? The back of your neck, your back, and your buttocks. 2. Do not rinse. Discard the cloth and let the area air-dry. Do not put any substances on your body afterward--such as powder, lotion, or perfume--unless you are told to do so by your health care provider. Only use lotions that are recommended by the manufacturer. 3. Put on clean clothes or pajamas. Contact a health care provider if:  Your skin gets irritated after scrubbing.  You have questions about using your solution or cloth. Get help right away if:  Your eyes become very red or swollen.  Your eyes itch badly.  Your skin itches badly and is red or swollen.  Your hearing changes.  You have trouble seeing.  You have swelling or tingling in your mouth or throat.  You have trouble breathing.  You swallow any chlorhexidine. Summary  Chlorhexidine gluconate (CHG) is a germ-killing (antiseptic) solution that is used to clean the skin. Cleaning your skin with CHG may help to lower your risk for infection.  You may be given CHG to use for bathing. It may be in a bottle or in a prepackaged cloth to  use on your skin. Carefully follow your health care provider's instructions and the instructions on the product label.  Do not use CHG if you have a chlorhexidine allergy.  Contact your health care provider if your skin gets irritated after scrubbing. This information is not intended to replace advice given to you by your health care provider. Make sure you discuss any questions you have with your health care provider. Document Revised: 12/08/2018 Document Reviewed: 08/19/2017 Elsevier Patient Education  [place].

## 2020-07-05 ENCOUNTER — Other Ambulatory Visit: Payer: Self-pay

## 2020-07-05 ENCOUNTER — Other Ambulatory Visit (HOSPITAL_COMMUNITY)
Admission: RE | Admit: 2020-07-05 | Discharge: 2020-07-05 | Disposition: A | Discharge status: Home / Self Care | Payer: Medicare Other | Source: Ambulatory Visit | Attending: Urology | Admitting: Urology

## 2020-07-05 ENCOUNTER — Encounter (HOSPITAL_COMMUNITY): Payer: Self-pay

## 2020-07-05 ENCOUNTER — Encounter (HOSPITAL_COMMUNITY)
Admission: RE | Admit: 2020-07-05 | Discharge: 2020-07-05 | Disposition: A | Discharge status: Home / Self Care | Payer: Medicare Other | Source: Ambulatory Visit | Attending: Urology | Admitting: Urology

## 2020-07-05 DIAGNOSIS — Z20822 Contact with and (suspected) exposure to covid-19: Secondary | ICD-10-CM | POA: Insufficient documentation

## 2020-07-05 DIAGNOSIS — Z01818 Encounter for other preprocedural examination: Secondary | ICD-10-CM | POA: Insufficient documentation

## 2020-07-05 DIAGNOSIS — Z01812 Encounter for preprocedural laboratory examination: Secondary | ICD-10-CM | POA: Insufficient documentation

## 2020-07-05 HISTORY — DX: Type 2 diabetes mellitus without complications: E11.9

## 2020-07-05 HISTORY — DX: Pure hypercholesterolemia, unspecified: E78.00

## 2020-07-05 LAB — CBC WITH DIFFERENTIAL/PLATELET
Abs Immature Granulocytes: 0.02 10*3/uL (ref 0.00–0.07)
Basophils Absolute: 0 10*3/uL (ref 0.0–0.1)
Basophils Relative: 1 %
Eosinophils Absolute: 0.2 10*3/uL (ref 0.0–0.5)
Eosinophils Relative: 3 %
HCT: 41.5 % (ref 39.0–52.0)
Hemoglobin: 13.6 g/dL (ref 13.0–17.0)
Immature Granulocytes: 0 %
Lymphocytes Relative: 41 %
Lymphs Abs: 2.6 10*3/uL (ref 0.7–4.0)
MCH: 30.5 pg (ref 26.0–34.0)
MCHC: 32.8 g/dL (ref 30.0–36.0)
MCV: 93 fL (ref 80.0–100.0)
Monocytes Absolute: 0.6 10*3/uL (ref 0.1–1.0)
Monocytes Relative: 9 %
Neutro Abs: 3 10*3/uL (ref 1.7–7.7)
Neutrophils Relative %: 46 %
Platelets: 185 10*3/uL (ref 150–400)
RBC: 4.46 MIL/uL (ref 4.22–5.81)
RDW: 12.7 % (ref 11.5–15.5)
WBC: 6.4 10*3/uL (ref 4.0–10.5)
nRBC: 0 % (ref 0.0–0.2)

## 2020-07-05 LAB — SARS CORONAVIRUS 2 (TAT 6-24 HRS): SARS Coronavirus 2: NEGATIVE

## 2020-07-08 ENCOUNTER — Ambulatory Visit (HOSPITAL_COMMUNITY): Payer: Medicare Other | Admitting: Anesthesiology

## 2020-07-08 ENCOUNTER — Encounter (HOSPITAL_COMMUNITY): Payer: Self-pay | Admitting: Urology

## 2020-07-08 ENCOUNTER — Encounter (HOSPITAL_COMMUNITY)
Admission: RE | Disposition: A | Discharge status: Home / Self Care | Payer: Self-pay | Source: Home / Self Care | Attending: Urology

## 2020-07-08 ENCOUNTER — Ambulatory Visit (HOSPITAL_COMMUNITY)
Admission: RE | Admit: 2020-07-08 | Discharge: 2020-07-08 | Disposition: A | Discharge status: Home / Self Care | Payer: Medicare Other | Attending: Urology | Admitting: Urology

## 2020-07-08 ENCOUNTER — Ambulatory Visit (HOSPITAL_COMMUNITY): Payer: Medicare Other

## 2020-07-08 DIAGNOSIS — R3129 Other microscopic hematuria: Secondary | ICD-10-CM | POA: Diagnosis not present

## 2020-07-08 DIAGNOSIS — Z87442 Personal history of urinary calculi: Secondary | ICD-10-CM | POA: Insufficient documentation

## 2020-07-08 DIAGNOSIS — G4730 Sleep apnea, unspecified: Secondary | ICD-10-CM | POA: Diagnosis not present

## 2020-07-08 DIAGNOSIS — D494 Neoplasm of unspecified behavior of bladder: Secondary | ICD-10-CM

## 2020-07-08 DIAGNOSIS — E039 Hypothyroidism, unspecified: Secondary | ICD-10-CM | POA: Diagnosis not present

## 2020-07-08 DIAGNOSIS — Z79890 Hormone replacement therapy (postmenopausal): Secondary | ICD-10-CM | POA: Diagnosis not present

## 2020-07-08 DIAGNOSIS — Z419 Encounter for procedure for purposes other than remedying health state, unspecified: Secondary | ICD-10-CM

## 2020-07-08 DIAGNOSIS — C679 Malignant neoplasm of bladder, unspecified: Secondary | ICD-10-CM | POA: Diagnosis not present

## 2020-07-08 DIAGNOSIS — N3289 Other specified disorders of bladder: Secondary | ICD-10-CM | POA: Diagnosis not present

## 2020-07-08 DIAGNOSIS — C676 Malignant neoplasm of ureteric orifice: Secondary | ICD-10-CM | POA: Diagnosis not present

## 2020-07-08 HISTORY — PX: TRANSURETHRAL RESECTION OF BLADDER TUMOR: SHX2575

## 2020-07-08 HISTORY — PX: CYSTOSCOPY W/ URETERAL STENT PLACEMENT: SHX1429

## 2020-07-08 LAB — GLUCOSE, CAPILLARY
Glucose-Capillary: 110 mg/dL — ABNORMAL HIGH (ref 70–99)
Glucose-Capillary: 109 mg/dL — ABNORMAL HIGH (ref 70–99)

## 2020-07-08 SURGERY — CYSTOSCOPY, WITH RETROGRADE PYELOGRAM AND URETERAL STENT INSERTION
Anesthesia: General | Site: Bladder

## 2020-07-08 MED ORDER — FENTANYL CITRATE (PF) 100 MCG/2ML IJ SOLN: 25.0000 ug | INTRAMUSCULAR | Status: DC | PRN

## 2020-07-08 MED ORDER — LACTATED RINGERS IV SOLN
INTRAVENOUS | Status: DC
  Administered 2020-07-08: 1000 mL via INTRAVENOUS

## 2020-07-08 MED ORDER — PROPOFOL 10 MG/ML IV BOLUS
INTRAVENOUS | Status: AC
  Filled 2020-07-08: qty 20

## 2020-07-08 MED ORDER — CEFAZOLIN SODIUM-DEXTROSE 2-4 GM/100ML-% IV SOLN
2.0000 g | INTRAVENOUS | Status: AC
  Administered 2020-07-08: 2 g via INTRAVENOUS

## 2020-07-08 MED ORDER — PROPOFOL 10 MG/ML IV BOLUS
INTRAVENOUS | Status: DC | PRN
  Administered 2020-07-08: 160 mg via INTRAVENOUS

## 2020-07-08 MED ORDER — OXYCODONE-ACETAMINOPHEN 5-325 MG PO TABS: 1.0000 | ORAL_TABLET | ORAL | 0 refills | Status: DC | PRN

## 2020-07-08 MED ORDER — FENTANYL CITRATE (PF) 100 MCG/2ML IJ SOLN
INTRAMUSCULAR | Status: DC | PRN
Start: 2020-07-08 — End: 2020-07-08
  Administered 2020-07-08: 50 ug via INTRAVENOUS

## 2020-07-08 MED ORDER — LIDOCAINE HCL (CARDIAC) PF 50 MG/5ML IV SOSY
PREFILLED_SYRINGE | INTRAVENOUS | Status: DC | PRN
  Administered 2020-07-08: 60 mg via INTRAVENOUS

## 2020-07-08 MED ORDER — ONDANSETRON HCL 4 MG/2ML IJ SOLN
INTRAMUSCULAR | Status: DC | PRN
  Administered 2020-07-08: 4 mg via INTRAVENOUS

## 2020-07-08 MED ORDER — DIATRIZOATE MEGLUMINE 30 % UR SOLN
URETHRAL | Status: AC
  Filled 2020-07-08: qty 100

## 2020-07-08 MED ORDER — SODIUM CHLORIDE 0.9 % IR SOLN
Status: DC | PRN
  Administered 2020-07-08: 3000 mL

## 2020-07-08 MED ORDER — ONDANSETRON HCL 4 MG/2ML IJ SOLN: 4.0000 mg | Freq: Once | INTRAMUSCULAR | Status: DC | PRN

## 2020-07-08 MED ORDER — FENTANYL CITRATE (PF) 100 MCG/2ML IJ SOLN
INTRAMUSCULAR | Status: AC
  Filled 2020-07-08: qty 2

## 2020-07-08 MED ORDER — STERILE WATER FOR IRRIGATION IR SOLN
Status: DC | PRN
  Administered 2020-07-08: 500 mL

## 2020-07-08 MED ORDER — LIDOCAINE 2% (20 MG/ML) 5 ML SYRINGE
INTRAMUSCULAR | Status: AC
  Filled 2020-07-08: qty 10

## 2020-07-08 MED ORDER — DIATRIZOATE MEGLUMINE 30 % UR SOLN
URETHRAL | Status: DC | PRN
  Administered 2020-07-08: 10 mL via URETHRAL

## 2020-07-08 MED ORDER — ORAL CARE MOUTH RINSE: 15.0000 mL | Freq: Once | OROMUCOSAL | Status: AC

## 2020-07-08 MED ORDER — CHLORHEXIDINE GLUCONATE 0.12 % MT SOLN
15.0000 mL | Freq: Once | OROMUCOSAL | Status: AC
  Administered 2020-07-08: 15 mL via OROMUCOSAL

## 2020-07-08 MED ORDER — CEFAZOLIN SODIUM-DEXTROSE 2-4 GM/100ML-% IV SOLN
INTRAVENOUS | Status: AC
  Filled 2020-07-08: qty 100

## 2020-07-08 SURGICAL SUPPLY — 32 items
BAG DRAIN URO TABLE W/ADPT NS (BAG) ×4 IMPLANT
BAG DRN 8 ADPR NS SKTRN CSTL (BAG) ×2
BAG DRN RND TRDRP ANRFLXCHMBR (UROLOGICAL SUPPLIES) ×2
BAG HAMPER (MISCELLANEOUS) ×4 IMPLANT
BAG URINE DRAIN 2000ML AR STRL (UROLOGICAL SUPPLIES) ×4 IMPLANT
CATH FOLEY 3WAY 30CC 22F (CATHETERS) ×4 IMPLANT
CATH INTERMIT  6FR 70CM (CATHETERS) ×4 IMPLANT
CLOTH BEACON ORANGE TIMEOUT ST (SAFETY) ×4 IMPLANT
DECANTER SPIKE VIAL GLASS SM (MISCELLANEOUS) ×4 IMPLANT
ELECT LOOP 22F BIPOLAR SML (ELECTROSURGICAL) ×4
ELECT REM PT RETURN 9FT ADLT (ELECTROSURGICAL) ×4
ELECTRODE LOOP 22F BIPOLAR SML (ELECTROSURGICAL) ×2 IMPLANT
ELECTRODE REM PT RTRN 9FT ADLT (ELECTROSURGICAL) ×2 IMPLANT
GLOVE BIO SURGEON STRL SZ8 (GLOVE) ×4 IMPLANT
GLOVE BIOGEL PI IND STRL 7.0 (GLOVE) ×4 IMPLANT
GLOVE BIOGEL PI INDICATOR 7.0 (GLOVE) ×4
GOWN STRL REUS W/TWL LRG LVL3 (GOWN DISPOSABLE) ×4 IMPLANT
GOWN STRL REUS W/TWL XL LVL3 (GOWN DISPOSABLE) ×4 IMPLANT
GUIDEWIRE STR ZIPWIRE 035X150 (MISCELLANEOUS) ×4 IMPLANT
IV NS IRRIG 3000ML ARTHROMATIC (IV SOLUTION) ×4 IMPLANT
KIT TURNOVER CYSTO (KITS) ×4 IMPLANT
MANIFOLD NEPTUNE II (INSTRUMENTS) ×4 IMPLANT
PACK CYSTO (CUSTOM PROCEDURE TRAY) ×4 IMPLANT
PAD ARMBOARD 7.5X6 YLW CONV (MISCELLANEOUS) ×4 IMPLANT
PLUG CATH AND CAP STER (CATHETERS) ×4 IMPLANT
STENT URET 6FRX26 CONTOUR (STENTS) ×4 IMPLANT
SYR 10ML LL (SYRINGE) ×4 IMPLANT
SYR 30ML LL (SYRINGE) ×4 IMPLANT
SYR TOOMEY IRRIG 70ML (MISCELLANEOUS) ×4
SYRINGE TOOMEY IRRIG 70ML (MISCELLANEOUS) ×2 IMPLANT
TOWEL OR 17X26 4PK STRL BLUE (TOWEL DISPOSABLE) ×4 IMPLANT
WATER STERILE IRR 500ML POUR (IV SOLUTION) ×4 IMPLANT

## 2020-07-08 NOTE — H&P (Signed)
microhematuria  HPI: Darrell Rivas is a 69yo here for followup for microhematuria. He underwent CT hematuria protocol which showed left lower pole calculi and a concern for a tumor in the bladder. No denies any LUTS. He is retired Engineer, building services.    PMH:     Past Medical History:  Diagnosis Date  . Hypothyroidism   . Kidney stones   . Sleep apnea   . Thyroid disease     Surgical History: No past surgical history on file.  Home Medications:  Allergies as of 06/05/2020   No Known Allergies        Medication List       Accurate as of June 05, 2020 12:30 PM. If you have any questions, ask your nurse or doctor.        levothyroxine 150 MCG tablet Commonly known as: SYNTHROID Take 1 tablet (150 mcg total) by mouth daily before breakfast. What changed: how much to take   Euthyrox 125 MCG tablet Generic drug: levothyroxine Take 125 mcg by mouth daily. What changed: Another medication with the same name was changed. Make sure you understand how and when to take each.   Red Yeast Rice 600 MG Tabs Take 1 tablet (600 mg total) by mouth daily.       Allergies: No Known Allergies  Family History:      Family History  Problem Relation Age of Onset  . Cancer Mother   . Stroke Father   . Cancer Sister   . Diabetes Maternal Grandmother     Social History:  reports that he has never smoked. He has never used smokeless tobacco. He reports that he does not drink alcohol. No history on file for drug use.  ROS: All other review of systems were reviewed and are negative except what is noted above in HPI  Physical Exam: BP (!) 148/85   Pulse 84   Temp 98 F (36.7 C)   Ht 5\' 7"  (1.702 m)   Wt 240 lb (108.9 kg)   BMI 37.59 kg/m   Constitutional:  Alert and oriented, No acute distress. HEENT: [place] AT, moist mucus membranes.  Trachea midline, no masses. Cardiovascular: No clubbing, cyanosis, or edema. Respiratory: Normal respiratory effort,  no increased work of breathing. GI: Abdomen is soft, nontender, nondistended, no abdominal masses GU: No CVA tenderness.  Lymph: No cervical or inguinal lymphadenopathy. Skin: No rashes, bruises or suspicious lesions. Neurologic: Grossly intact, no focal deficits, moving all 4 extremities. Psychiatric: Normal mood and affect.  Laboratory Data: Recent Labs  No results found for: WBC, HGB, HCT, MCV, PLT    Recent Labs       Lab Results  Component Value Date   CREATININE 0.94 04/29/2020      Recent Labs  No results found for: PSA    Recent Labs  No results found for: TESTOSTERONE    Recent Labs  No results found for: HGBA1C    Urinalysis Labs (Brief)          Component Value Date/Time   APPEARANCEUR Clear 06/05/2020 1127   GLUCOSEU Trace (A) 06/05/2020 1127   BILIRUBINUR Negative 06/05/2020 1127   PROTEINUR Negative 06/05/2020 1127   NITRITE Negative 06/05/2020 1127   LEUKOCYTESUR Negative 06/05/2020 1127      Recent Labs       Lab Results  Component Value Date   LABMICR Comment 06/05/2020      Pertinent Imaging: CT hematuria 05/20/2020: Images reviewed and discussed with the patient No results  found for this or any previous visit.  No results found for this or any previous visit.  No results found for this or any previous visit.  No results found for this or any previous visit.  No results found for this or any previous visit.  No results found for this or any previous visit.  Results for orders placed during the hospital encounter of 05/20/20  CT HEMATURIA WORKUP  Narrative CLINICAL DATA:  Gross hematuria. Began over 6 weeks ago with history of uric acid calculi.  EXAM: CT ABDOMEN AND PELVIS WITHOUT AND WITH CONTRAST  TECHNIQUE: Multidetector CT imaging of the abdomen and pelvis was performed following the standard protocol before and following the bolus administration of intravenous contrast.  CONTRAST:   191mL OMNIPAQUE IOHEXOL 300 MG/ML  SOLN  COMPARISON:  June 28, 2019  FINDINGS: Lower chest: Lung bases are clear.  Hepatobiliary: Low-density hepatic parenchyma compatible with steatosis, lobular hepatic contours without focal, suspicious hepatic lesion. Portal vein is patent. Hepatic veins are patent.  Pancreas: Pancreas is normal.  Spleen: Granulomatous changes in the normal size spleen.  Adrenals/Urinary Tract: Adrenal glands are normal.  Nephrolithiasis on the LEFT 3-4 mm calculus in the lower pole LEFT kidney. No ureteral calculi. Urinary bladder is under distended limiting assessment. There is some nodularity however at the LEFT bladder base this is near the LEFT ureterovesicular orifice best seen on images 80 of series 3 and image 89 of series 11.  Also seen on delayed phase imaging is a small nodular filling defect (image 80, series 4), additionally seen on coronal dataset (image 86, series 15)  Stomach/Bowel: No acute gastrointestinal process. Colonic diverticulosis. Stable appearance of central mesenteric stranding and scattered small lymph nodes in the small bowel mesentery.  Vascular/Lymphatic: Normal caliber abdominal aorta no adenopathy in the retroperitoneum or in the upper abdomen. No pelvic adenopathy.  Reproductive: Prostate with mild heterogeneity. No pelvic adenopathy.  Other: Small fat containing umbilical hernia.  Musculoskeletal: No acute bone finding. No destructive bone process. Spinal degenerative changes.  IMPRESSION: 1. Nodular filling defect at the LEFT bladder base with perceived enhancement, suspicious for small urothelial neoplasm. Cystoscopy is suggested for further evaluation. 2. Nephrolithiasis on the LEFT. 3. Hepatic steatosis. 4. Colonic diverticulosis. 5. Small fat containing umbilical hernia. 6. Aortic atherosclerosis.  These results will be called to the ordering clinician or representative by the  Radiologist Assistant, and communication documented in the PACS or [employer].  Aortic Atherosclerosis (ICD10-I70.0).   Electronically Signed By: Zetta Bills M.D. On: 05/20/2020 16:20  No results found for this or any previous visit.      Assessment & Plan:    1. bladder tumor We discussed the management including transurethral resection and the patient wishes to proceed with surgery. Risks/benefits/alternatives discussed    No follow-ups on file.  Nicolette Bang, MD  [facility] [place]

## 2020-07-08 NOTE — Anesthesia Procedure Notes (Signed)
Procedure Name: LMA Insertion Date/Time: 07/08/2020 9:09 AM Performed by: Ollen Bowl, CRNA Pre-anesthesia Checklist: Patient identified, Patient being monitored, Emergency Drugs available, Timeout performed and Suction available Patient Re-evaluated:Patient Re-evaluated prior to induction Oxygen Delivery Method: Circle System Utilized Preoxygenation: Pre-oxygenation with 100% oxygen Induction Type: IV induction Ventilation: Mask ventilation without difficulty LMA: LMA inserted LMA Size: 4.0 Number of attempts: 1 Placement Confirmation: positive ETCO2 and breath sounds checked- equal and bilateral

## 2020-07-08 NOTE — Op Note (Signed)
Preoperative diagnosis: Bladder Tumor  Postop diagnosis: Same  Procedure: 1.  Cystoscopy 2. Bilateral retrograde pyelography 3. Intra-operative fluoroscopy, under 1 hour, with interpretation 4.Transurethral resection of bladder tumor, small 5. Left 6x26 JJ ureteral Stent Placement     Attending: Nicolette Bang  Anesthesia: General  Estimated blood loss: 5 cc  Drains: 1. 22 French Foley catheter 2. Left 6x26 JJ ureteral Stent without tether  Specimens: Bladder tumor  Antibiotics: Ancef  Findings: 1.5cm papillary tumor involving the left ureteral orifice. Ureteral orifices in normal anatomic location. No hydronephrosis in either collecting system  Indications: Patient is a 69 year old with a history of  bladder tumor found on office cystoscopy.   After discussing treatment options patient decided to proceed with transurethral resection of bladder tumor  Procedure in detail: Prior to procedure consetn was obtained. Patient was brought to the operating room and briefing was done sure correct patient, correct procedure, correct site.  General anesthesia was in administered patient was placed in the dorsal lithotomy position.  The rigid 80 French cystoscope was passed urethra and bladder.  Bladder was inspected masses or lesions and we noted a 1.5 cm lesion involving the left ureteral orifice  We then cannulated the right ureteral orifice with a 6 French ureteral catheter.  A gentle retrograde was obtained in findings noted above.  We then turned our attention to the left ureteral orifice.  The left ureteral orifice was cannulated with a 6 French ureteral catheter.  A gentle retrograde was obtained in findings noted above.  We then placed a zip wire through the ureteral catheter and advanced up to the renal pelvis. We then placed a 6 x 26 double-J ureteral stent over the wire.  We then removed the wire and good coil was noted in the pelvis under fluoroscopy in the bladder under direct vision.   Removed the cystoscope and placed a 57 French resectoscope in the bladder.  Using bipolar electrocautery were then removed the bladder tumor.    We then removed the fragments and sent them for pathology.  To obtain hemostasis we then cauterized the bed of the tumors.  Once good hemostasis was noted the bladder was then drained and a 22 French Foley catheter was placed. This concluded the procedure which resulted by the patient.  Complications: None  Condition: Stable,  extubated, transferred to PACU.  Plan: the patient is to be discharged home and followup in 1 week for a voiding trial and pathology discussion

## 2020-07-08 NOTE — H&P (View-Only) (Signed)
microhematuria  HPI: Darrell Rivas is a 69yo here for followup for microhematuria. He underwent CT hematuria protocol which showed left lower pole calculi and a concern for a tumor in the bladder. No denies any LUTS. He is retired Engineer, building services.    PMH:     Past Medical History:  Diagnosis Date  . Hypothyroidism   . Kidney stones   . Sleep apnea   . Thyroid disease     Surgical History: No past surgical history on file.  Home Medications:  Allergies as of 06/05/2020   No Known Allergies        Medication List       Accurate as of June 05, 2020 12:30 PM. If you have any questions, ask your nurse or doctor.        levothyroxine 150 MCG tablet Commonly known as: SYNTHROID Take 1 tablet (150 mcg total) by mouth daily before breakfast. What changed: how much to take   Euthyrox 125 MCG tablet Generic drug: levothyroxine Take 125 mcg by mouth daily. What changed: Another medication with the same name was changed. Make sure you understand how and when to take each.   Red Yeast Rice 600 MG Tabs Take 1 tablet (600 mg total) by mouth daily.       Allergies: No Known Allergies  Family History:      Family History  Problem Relation Age of Onset  . Cancer Mother   . Stroke Father   . Cancer Sister   . Diabetes Maternal Grandmother     Social History:  reports that he has never smoked. He has never used smokeless tobacco. He reports that he does not drink alcohol. No history on file for drug use.  ROS: All other review of systems were reviewed and are negative except what is noted above in HPI  Physical Exam: BP (!) 148/85   Pulse 84   Temp 98 F (36.7 C)   Ht 5\' 7"  (1.702 m)   Wt 240 lb (108.9 kg)   BMI 37.59 kg/m   Constitutional:  Alert and oriented, No acute distress. HEENT: [place] AT, moist mucus membranes.  Trachea midline, no masses. Cardiovascular: No clubbing, cyanosis, or edema. Respiratory: Normal respiratory effort,  no increased work of breathing. GI: Abdomen is soft, nontender, nondistended, no abdominal masses GU: No CVA tenderness.  Lymph: No cervical or inguinal lymphadenopathy. Skin: No rashes, bruises or suspicious lesions. Neurologic: Grossly intact, no focal deficits, moving all 4 extremities. Psychiatric: Normal mood and affect.  Laboratory Data: Recent Labs  No results found for: WBC, HGB, HCT, MCV, PLT    Recent Labs       Lab Results  Component Value Date   CREATININE 0.94 04/29/2020      Recent Labs  No results found for: PSA    Recent Labs  No results found for: TESTOSTERONE    Recent Labs  No results found for: HGBA1C    Urinalysis Labs (Brief)          Component Value Date/Time   APPEARANCEUR Clear 06/05/2020 1127   GLUCOSEU Trace (A) 06/05/2020 1127   BILIRUBINUR Negative 06/05/2020 1127   PROTEINUR Negative 06/05/2020 1127   NITRITE Negative 06/05/2020 1127   LEUKOCYTESUR Negative 06/05/2020 1127      Recent Labs       Lab Results  Component Value Date   LABMICR Comment 06/05/2020      Pertinent Imaging: CT hematuria 05/20/2020: Images reviewed and discussed with the patient No results  found for this or any previous visit.  No results found for this or any previous visit.  No results found for this or any previous visit.  No results found for this or any previous visit.  No results found for this or any previous visit.  No results found for this or any previous visit.  Results for orders placed during the hospital encounter of 05/20/20  CT HEMATURIA WORKUP  Narrative CLINICAL DATA:  Gross hematuria. Began over 6 weeks ago with history of uric acid calculi.  EXAM: CT ABDOMEN AND PELVIS WITHOUT AND WITH CONTRAST  TECHNIQUE: Multidetector CT imaging of the abdomen and pelvis was performed following the standard protocol before and following the bolus administration of intravenous contrast.  CONTRAST:   157mL OMNIPAQUE IOHEXOL 300 MG/ML  SOLN  COMPARISON:  June 28, 2019  FINDINGS: Lower chest: Lung bases are clear.  Hepatobiliary: Low-density hepatic parenchyma compatible with steatosis, lobular hepatic contours without focal, suspicious hepatic lesion. Portal vein is patent. Hepatic veins are patent.  Pancreas: Pancreas is normal.  Spleen: Granulomatous changes in the normal size spleen.  Adrenals/Urinary Tract: Adrenal glands are normal.  Nephrolithiasis on the LEFT 3-4 mm calculus in the lower pole LEFT kidney. No ureteral calculi. Urinary bladder is under distended limiting assessment. There is some nodularity however at the LEFT bladder base this is near the LEFT ureterovesicular orifice best seen on images 80 of series 3 and image 89 of series 11.  Also seen on delayed phase imaging is a small nodular filling defect (image 80, series 4), additionally seen on coronal dataset (image 86, series 15)  Stomach/Bowel: No acute gastrointestinal process. Colonic diverticulosis. Stable appearance of central mesenteric stranding and scattered small lymph nodes in the small bowel mesentery.  Vascular/Lymphatic: Normal caliber abdominal aorta no adenopathy in the retroperitoneum or in the upper abdomen. No pelvic adenopathy.  Reproductive: Prostate with mild heterogeneity. No pelvic adenopathy.  Other: Small fat containing umbilical hernia.  Musculoskeletal: No acute bone finding. No destructive bone process. Spinal degenerative changes.  IMPRESSION: 1. Nodular filling defect at the LEFT bladder base with perceived enhancement, suspicious for small urothelial neoplasm. Cystoscopy is suggested for further evaluation. 2. Nephrolithiasis on the LEFT. 3. Hepatic steatosis. 4. Colonic diverticulosis. 5. Small fat containing umbilical hernia. 6. Aortic atherosclerosis.  These results will be called to the ordering clinician or representative by the  Radiologist Assistant, and communication documented in the PACS or [employer].  Aortic Atherosclerosis (ICD10-I70.0).   Electronically Signed By: Zetta Bills M.D. On: 05/20/2020 16:20  No results found for this or any previous visit.      Assessment & Plan:    1. bladder tumor We discussed the management including transurethral resection and the patient wishes to proceed with surgery. Risks/benefits/alternatives discussed    No follow-ups on file.  Nicolette Bang, MD  [facility] [place]

## 2020-07-08 NOTE — Anesthesia Preprocedure Evaluation (Signed)
Anesthesia Evaluation  Patient identified by MRN, date of birth, ID band Patient awake    Reviewed: Allergy & Precautions, H&P , NPO status , Patient's Chart, lab work & pertinent test results, reviewed documented beta blocker date and time   Airway Mallampati: II  TM Distance: >3 FB Neck ROM: full    Dental no notable dental hx. (+) Teeth Intact   Pulmonary sleep apnea ,    Pulmonary exam normal breath sounds clear to auscultation       Cardiovascular Exercise Tolerance: Good negative cardio ROS   Rhythm:regular Rate:Normal     Neuro/Psych negative neurological ROS  negative psych ROS   GI/Hepatic negative GI ROS, Neg liver ROS,   Endo/Other  diabetesHypothyroidism   Renal/GU Renal disease  negative genitourinary   Musculoskeletal   Abdominal   Peds  Hematology negative hematology ROS (+)   Anesthesia Other Findings   Reproductive/Obstetrics negative OB ROS                             Anesthesia Physical  Anesthesia Plan  ASA: II  Anesthesia Plan: General   Post-op Pain Management:    Induction:   PONV Risk Score and Plan:   Airway Management Planned:   Additional Equipment:   Intra-op Plan:   Post-operative Plan:   Informed Consent: I have reviewed the patients History and Physical, chart, labs and discussed the procedure including the risks, benefits and alternatives for the proposed anesthesia with the patient or authorized representative who has indicated his/her understanding and acceptance.     Dental Advisory Given  Plan Discussed with: CRNA  Anesthesia Plan Comments:         Anesthesia Quick Evaluation  

## 2020-07-08 NOTE — Discharge Instructions (Signed)
Indwelling Urinary Catheter Care, Adult An indwelling urinary catheter is a thin tube that is put into your bladder. The tube helps to drain pee (urine) out of your body. The tube goes in through your urethra. Your urethra is where pee comes out of your body. Your pee will come out through the catheter, then it will go into a bag (drainage bag). Take good care of your catheter so it will work well. How to wear your catheter and bag Supplies needed  Sticky tape (adhesive tape) or a leg strap.  Alcohol wipe or soap and water (if you use tape).  A clean towel (if you use tape).  Large overnight bag.  Smaller bag (leg bag). Wearing your catheter Attach your catheter to your leg with tape or a leg strap.  Make sure the catheter is not pulled tight.  If a leg strap gets wet, take it off and put on a dry strap.  If you use tape to hold the bag on your leg: 1. Use an alcohol wipe or soap and water to wash your skin where the tape made it sticky before. 2. Use a clean towel to pat-dry that skin. 3. Use new tape to make the bag stay on your leg. Wearing your bags You should have been given a large overnight bag.  You may wear the overnight bag in the day or night.  Always have the overnight bag lower than your bladder.  Do not let the bag touch the floor.  Before you go to sleep, put a clean plastic bag in a wastebasket. Then hang the overnight bag inside the wastebasket. You should also have a smaller leg bag that fits under your clothes.  Always wear the leg bag below your knee.  Do not wear your leg bag at night. How to care for your skin and catheter Supplies needed  A clean washcloth.  Water and mild soap.  A clean towel. Caring for your skin and catheter      Clean the skin around your catheter every day: 1. Wash your hands with soap and water. 2. Wet a clean washcloth in warm water and mild soap. 3. Clean the skin around your urethra.  If you are  male:  Gently spread the folds of skin around your vagina (labia).  With the washcloth in your other hand, wipe the inner side of your labia on each side. Wipe from front to back.  If you are male:  Pull back any skin that covers the end of your penis (foreskin).  With the washcloth in your other hand, wipe your penis in small circles. Start wiping at the tip of your penis, then move away from the catheter.  Move the foreskin back in place, if needed. 4. With your free hand, hold the catheter close to where it goes into your body.  Keep holding the catheter during cleaning so it does not get pulled out. 5. With the washcloth in your other hand, clean the catheter.  Only wipe downward on the catheter.  Do not wipe upward toward your body. Doing this may push germs into your urethra and cause infection. 6. Use a clean towel to pat-dry the catheter and the skin around it. Make sure to wipe off all soap. 7. Wash your hands with soap and water.  Shower every day. Do not take baths.  Do not use cream, ointment, or lotion on the area where the catheter goes into your body, unless your doctor tells you   to.  Do not use powders, sprays, or lotions on your genital area.  Check your skin around the catheter every day for signs of infection. Check for: ? Redness, swelling, or pain. ? Fluid or blood. ? Warmth. ? Pus or a bad smell. How to empty the bag Supplies needed  Rubbing alcohol.  Gauze pad or cotton ball.  Tape or a leg strap. Emptying the bag Pour the pee out of your bag when it is ?- full, or at least 2-3 times a day. Do this for your overnight bag and your leg bag. 1. Wash your hands with soap and water. 2. Separate (detach) the bag from your leg. 3. Hold the bag over the toilet or a clean pail. Keep the bag lower than your hips and bladder. This is so the pee (urine) does not go back into the tube. 4. Open the pour spout. It is at the bottom of the bag. 5. Empty the  pee into the toilet or pail. Do not let the pour spout touch any surface. 6. Put rubbing alcohol on a gauze pad or cotton ball. 7. Use the gauze pad or cotton ball to clean the pour spout. 8. Close the pour spout. 9. Attach the bag to your leg with tape or a leg strap. 10. Wash your hands with soap and water. Follow instructions for cleaning the drainage bag:  From the product maker.  As told by your doctor. How to change the bag Supplies needed  Alcohol wipes.  A clean bag.  Tape or a leg strap. Changing the bag Replace your bag when it starts to leak, smell bad, or look dirty. 1. Wash your hands with soap and water. 2. Separate the dirty bag from your leg. 3. Pinch the catheter with your fingers so that pee does not spill out. 4. Separate the catheter tube from the bag tube where these tubes connect (at the connection valve). Do not let the tubes touch any surface. 5. Clean the end of the catheter tube with an alcohol wipe. Use a different alcohol wipe to clean the end of the bag tube. 6. Connect the catheter tube to the tube of the clean bag. 7. Attach the clean bag to your leg with tape or a leg strap. Do not make the bag tight on your leg. 8. Wash your hands with soap and water. General rules   Never pull on your catheter. Never try to take it out. Doing that can hurt you.  Always wash your hands before and after you touch your catheter or bag. Use a mild, fragrance-free soap. If you do not have soap and water, use hand sanitizer.  Always make sure there are no twists or bends (kinks) in the catheter tube.  Always make sure there are no leaks in the catheter or bag.  Drink enough fluid to keep your pee pale yellow.  Do not take baths, swim, or use a hot tub.  If you are male, wipe from front to back after you poop (have a bowel movement). Contact a doctor if:  Your pee is cloudy.  Your pee smells worse than usual.  Your catheter gets clogged.  Your catheter  leaks.  Your bladder feels full. Get help right away if:  You have redness, swelling, or pain where the catheter goes into your body.  You have fluid, blood, pus, or a bad smell coming from the area where the catheter goes into your body.  Your skin feels warm where   the catheter goes into your body.  You have a fever.  You have pain in your: ? Belly (abdomen). ? Legs. ? Lower back. ? Bladder.  You see blood in the catheter.  Your pee is pink or red.  You feel sick to your stomach (nauseous).  You throw up (vomit).  You have chills.  Your pee is not draining into the bag.  Your catheter gets pulled out. Summary  An indwelling urinary catheter is a thin tube that is placed into the bladder to help drain pee (urine) out of the body.  The catheter is placed into the part of the body that drains pee from the bladder (urethra).  Taking good care of your catheter will keep it working properly and help prevent problems.  Always wash your hands before and after touching your catheter or bag.  Never pull on your catheter or try to take it out. This information is not intended to replace advice given to you by your health care provider. Make sure you discuss any questions you have with your health care provider. Document Revised: 01/13/2019 Document Reviewed: 05/07/2017 Elsevier Patient Education  2020 [employer] Inc[employer]  

## 2020-07-08 NOTE — Anesthesia Postprocedure Evaluation (Signed)
Anesthesia Post Note  Patient: Darrell Rivas  Procedure(s) Performed: CYSTOSCOPY WITH BILATERAL RETROGRADE PYELOGRAM/ AND URETERAL STENT PLACEMENT (Bilateral ) TRANSURETHRAL RESECTION OF BLADDER TUMOR (TURBT) (N/A Bladder)  Patient location during evaluation: PACU Anesthesia Type: General Level of consciousness: awake and alert and oriented Pain management: pain level controlled Vital Signs Assessment: post-procedure vital signs reviewed and stable Respiratory status: spontaneous breathing Cardiovascular status: blood pressure returned to baseline and stable Postop Assessment: no apparent nausea or vomiting Anesthetic complications: no   No complications documented.   Last Vitals:  Vitals:   07/08/20 1030 07/08/20 1038  BP: 129/81 138/78  Pulse: 62 68  Resp: 15 16  Temp:  36.6 C  SpO2: 100% 100%    Last Pain:  Vitals:   07/08/20 1038  TempSrc: Oral  PainSc: 0-No pain                 Christorpher Hisaw

## 2020-07-08 NOTE — Transfer of Care (Signed)
Immediate Anesthesia Transfer of Care Note  Patient: Darrell Rivas  Procedure(s) Performed: CYSTOSCOPY WITH BILATERAL RETROGRADE PYELOGRAM/ AND URETERAL STENT PLACEMENT (Bilateral ) TRANSURETHRAL RESECTION OF BLADDER TUMOR (TURBT) (N/A Bladder)  Patient Location: PACU  Anesthesia Type:General  Level of Consciousness: awake  Airway & Oxygen Therapy: Patient Spontanous Breathing  Post-op Assessment: Report given to RN  Post vital signs: Reviewed  Last Vitals:  Vitals Value Taken Time  BP 124/80 07/08/20 0945  Temp    Pulse 68 07/08/20 0947  Resp 20 07/08/20 0947  SpO2 98 % 07/08/20 0947  Vitals shown include unvalidated device data.  Last Pain:  Vitals:   07/08/20 0722  TempSrc: Oral  PainSc: 0-No pain      Patients Stated Pain Goal: 6 (09/32/35 5732)  Complications: No complications documented.

## 2020-07-09 ENCOUNTER — Encounter (HOSPITAL_COMMUNITY): Payer: Self-pay | Admitting: Urology

## 2020-07-09 LAB — SURGICAL PATHOLOGY

## 2020-07-11 LAB — N-METHYL-D-ASPARTATE RECPT.IGG: N-methyl-D-Aspartate Recpt.IgG: <1:10 {titer}

## 2020-07-15 ENCOUNTER — Ambulatory Visit: Payer: Medicare Other

## 2020-07-15 ENCOUNTER — Ambulatory Visit (INDEPENDENT_AMBULATORY_CARE_PROVIDER_SITE_OTHER): Payer: Medicare Other | Admitting: Urology

## 2020-07-15 ENCOUNTER — Other Ambulatory Visit: Payer: Self-pay

## 2020-07-15 ENCOUNTER — Encounter: Payer: Self-pay | Admitting: Urology

## 2020-07-15 DIAGNOSIS — C672 Malignant neoplasm of lateral wall of bladder: Secondary | ICD-10-CM | POA: Diagnosis not present

## 2020-07-15 NOTE — H&P (View-Only) (Signed)
07/15/2020 10:40 AM   Darrell Rivas [birth year] [id number]  Referring provider: Curlene Labrum, MD [place],  [place] [address]  Followup bladder cancer  HPI: Mr Darrell Rivas is a 69yo here for followup after bladder tumor resection. Pathology TaG3. Voiding trial passed today. NO hematuria.  Pathology report reviewed with the patient.    PMH: Past Medical History:  Diagnosis Date   Diabetes mellitus without complication ([place])    Hypercholesteremia    Hypothyroidism    Kidney stones    Sleep apnea    Thyroid disease     Surgical History: Past Surgical History:  Procedure Laterality Date   COLONOSCOPY     CYSTOSCOPY W/ URETERAL STENT PLACEMENT Bilateral 07/08/2020   Procedure: CYSTOSCOPY WITH BILATERAL RETROGRADE PYELOGRAM/ AND LEFT URETERAL STENT PLACEMENT;  Surgeon: Cleon Gustin, MD;  Location: [facility];  Service: Urology;  Laterality: Bilateral;   TRANSURETHRAL RESECTION OF BLADDER TUMOR N/A 07/08/2020   Procedure: TRANSURETHRAL RESECTION OF BLADDER TUMOR (TURBT);  Surgeon: Cleon Gustin, MD;  Location: [facility];  Service: Urology;  Laterality: N/A;    Home Medications:  Allergies as of 07/15/2020   No Known Allergies     Medication List       Accurate as of July 15, 2020 10:40 AM. If you have any questions, ask your nurse or doctor.        atorvastatin 20 MG tablet Commonly known as: LIPITOR Take 20 mg by mouth daily.   Euthyrox 125 MCG tablet Generic drug: levothyroxine Take 125 mcg by mouth daily before breakfast.   latanoprost 0.005 % ophthalmic solution Commonly known as: XALATAN Place 1 drop into both eyes at bedtime.   metFORMIN 500 MG tablet Commonly known as: GLUCOPHAGE Take by mouth 2 (two) times daily with a meal.   oxyCODONE-acetaminophen 5-325 MG tablet Commonly known as: Percocet Take 1 tablet by mouth every 4 (four) hours as needed for severe pain.       Allergies: No Known Allergies  Family  History: Family History  Problem Relation Age of Onset   Cancer Mother    Stroke Father    Cancer Sister    Diabetes Maternal Grandmother     Social History:  reports that he has never smoked. He has never used smokeless tobacco. He reports that he does not drink alcohol and does not use drugs.  ROS: All other review of systems were reviewed and are negative except what is noted above in HPI  Physical Exam: There were no vitals taken for this visit.  Constitutional:  Alert and oriented, No acute distress. HEENT: [place] AT, moist mucus membranes.  Trachea midline, no masses. Cardiovascular: No clubbing, cyanosis, or edema. Respiratory: Normal respiratory effort, no increased work of breathing. GI: Abdomen is soft, nontender, nondistended, no abdominal masses GU: No CVA tenderness.  Lymph: No cervical or inguinal lymphadenopathy. Skin: No rashes, bruises or suspicious lesions. Neurologic: Grossly intact, no focal deficits, moving all 4 extremities. Psychiatric: Normal mood and affect.  Laboratory Data: Lab Results  Component Value Date   WBC 6.4 07/05/2020   HGB 13.6 07/05/2020   HCT 41.5 07/05/2020   MCV 93.0 07/05/2020   PLT 185 07/05/2020    Lab Results  Component Value Date   CREATININE 0.94 04/29/2020    No results found for: PSA  No results found for: TESTOSTERONE  No results found for: HGBA1C  Urinalysis    Component Value Date/Time   APPEARANCEUR Clear 06/05/2020 1127  GLUCOSEU Trace (A) 06/05/2020 1127   BILIRUBINUR Negative 06/05/2020 1127   PROTEINUR Negative 06/05/2020 1127   NITRITE Negative 06/05/2020 1127   LEUKOCYTESUR Negative 06/05/2020 1127    Lab Results  Component Value Date   LABMICR Comment 06/05/2020    Pertinent Imaging:  No results found for this or any previous visit.  No results found for this or any previous visit.  No results found for this or any previous visit.  No results found for this or any previous  visit.  No results found for this or any previous visit.  No results found for this or any previous visit.  Results for orders placed during the hospital encounter of 05/20/20  CT HEMATURIA WORKUP  Narrative CLINICAL DATA:  Gross hematuria. Began over 6 weeks ago with history of uric acid calculi.  EXAM: CT ABDOMEN AND PELVIS WITHOUT AND WITH CONTRAST  TECHNIQUE: Multidetector CT imaging of the abdomen and pelvis was performed following the standard protocol before and following the bolus administration of intravenous contrast.  CONTRAST:  170mL OMNIPAQUE IOHEXOL 300 MG/ML  SOLN  COMPARISON:  June 28, 2019  FINDINGS: Lower chest: Lung bases are clear.  Hepatobiliary: Low-density hepatic parenchyma compatible with steatosis, lobular hepatic contours without focal, suspicious hepatic lesion. Portal vein is patent. Hepatic veins are patent.  Pancreas: Pancreas is normal.  Spleen: Granulomatous changes in the normal size spleen.  Adrenals/Urinary Tract: Adrenal glands are normal.  Nephrolithiasis on the LEFT 3-4 mm calculus in the lower pole LEFT kidney. No ureteral calculi. Urinary bladder is under distended limiting assessment. There is some nodularity however at the LEFT bladder base this is near the LEFT ureterovesicular orifice best seen on images 80 of series 3 and image 89 of series 11.  Also seen on delayed phase imaging is a small nodular filling defect (image 80, series 4), additionally seen on coronal dataset (image 86, series 15)  Stomach/Bowel: No acute gastrointestinal process. Colonic diverticulosis. Stable appearance of central mesenteric stranding and scattered small lymph nodes in the small bowel mesentery.  Vascular/Lymphatic: Normal caliber abdominal aorta no adenopathy in the retroperitoneum or in the upper abdomen. No pelvic adenopathy.  Reproductive: Prostate with mild heterogeneity. No pelvic adenopathy.  Other: Small fat containing  umbilical hernia.  Musculoskeletal: No acute bone finding. No destructive bone process. Spinal degenerative changes.  IMPRESSION: 1. Nodular filling defect at the LEFT bladder base with perceived enhancement, suspicious for small urothelial neoplasm. Cystoscopy is suggested for further evaluation. 2. Nephrolithiasis on the LEFT. 3. Hepatic steatosis. 4. Colonic diverticulosis. 5. Small fat containing umbilical hernia. 6. Aortic atherosclerosis.  These results will be called to the ordering clinician or representative by the Radiologist Assistant, and communication documented in the PACS or [employer].  Aortic Atherosclerosis (ICD10-I70.0).   Electronically Signed By: Zetta Bills M.D. On: 05/20/2020 16:20  No results found for this or any previous visit.   Assessment & Plan:   1. Bladder cancer -We discussed the natural hx of high grade bladder cancer and the followup regiment. We dsicussed the value of repeating the resection and BCG therapy. The patient will be scheduled for bladder tumor resection in 3-4 weeks then BCG therapy  No follow-ups on file.  Nicolette Bang, MD  [facility] [place]

## 2020-07-15 NOTE — Progress Notes (Signed)
Fill and Pull Catheter Removal  Patient is present today for a catheter removal.  Patient was cleaned and prepped in a sterile fashion 126ml of sterile water/ saline was instilled into the bladder when the patient felt the urge to urinate. 65ml of water was then drained from the balloon.  A 16FR foley cath was removed from the bladder no complications were noted .  Patient as then given some time to void on their own.  Patient can void  115ml on their own after some time.  Patient tolerated well.  Performed by: Jorge Ny

## 2020-07-15 NOTE — Patient Instructions (Signed)
Bladder Cancer  Bladder cancer is an abnormal growth of tissue in the bladder. The bladder is the balloon-like sac in the pelvis. It collects and stores urine that comes from the kidneys through the ureters. The bladder wall is made of layers. If cancer spreads into these layers and through the wall of the bladder, it becomes more difficult to treat. What are the causes? The cause of this condition is not known. What increases the risk? The following factors may make you more likely to develop this condition:  Smoking.  Workplace risks (occupational exposures), such as rubber, leather, textile, dyes, chemicals, and paint.  Being white.  Your age. Most people with bladder cancer are over the age of 55.  Being male.  Having chronic bladder inflammation.  Having a personal history of bladder cancer.  Having a family history of bladder cancer (heredity).  Having had chemotherapy or radiation therapy to the pelvis.  Having been exposed to arsenic. What are the signs or symptoms? Initial symptoms of this condition include:  Blood in the urine.  Painful urination.  Frequent bladder or urine infections.  Increase in urgency and frequency of urination. Advanced symptoms of this condition include:  Not being able to urinate.  Low back pain on one side.  Loss of appetite.  Weight loss.  Fatigue.  Swelling in the feet.  Bone pain. How is this diagnosed? This condition is diagnosed based on your medical history, a physical exam, urine tests, lab tests, imaging tests, and your symptoms. You may also have other tests or procedures done, such as:  A narrow tube being inserted into your bladder through your urethra (cystoscopy) in order to view the lining of your bladder for tumors.  A biopsy to sample the tumor to see if cancer is present. If cancer is present, it will then be staged to determine its severity and extent. Staging is an assessment of:  The size of the  tumor.  Whether the cancer has spread.  Where the cancer has spread. It is important to know how deeply into the bladder wall cancer has grown and whether cancer has spread to any other parts of your body. Staging may require blood tests or imaging tests, such as a CT scan, MRI, bone scan, or chest X-ray. How is this treated? Based on the stage of cancer, one treatment or a combination of treatments may be recommended. The most common forms of treatment are:  Surgery to remove the cancer. Procedures that may be done include transurethral resection and cystectomy.  Radiation therapy. This is high-energy X-rays or other particles. This is often used in combination with chemotherapy.  Chemotherapy. During this treatment, medicines are used to kill cancer cells.  Immunotherapy. This uses medicines to help your own immune system destroy cancer cells. Follow these instructions at home:  Take over-the-counter and prescription medicines only as told by your health care provider.  Maintain a healthy diet. Some of your treatments might affect your appetite.  Consider joining a support group. This may help you learn to cope with the stress of having bladder cancer.  Tell your cancer care team if you develop side effects. They may be able to recommend ways to relieve them.  Keep all follow-up visits as told by your health care provider. This is important. Where to find more information  American Cancer Society: www.cancer.org  National Cancer Institute (NCI): www.cancer.gov Contact a health care provider if:  You have symptoms of a urinary tract infection. These include: ?   Fever. ? Chills. ? Weakness. ? Muscle aches. ? Abdominal pain. ? Frequent and intense urge to urinate. ? Burning feeling in the bladder or urethra during urination. Get help right away if:  There is blood in your urine.  You cannot urinate.  You have severe pain or other symptoms that do not go  away. Summary  Bladder cancer is an abnormal growth of tissue in the bladder.  This condition is diagnosed based on your medical history, a physical exam, urine tests, lab tests, imaging tests, and your symptoms.  Based on the stage of cancer, surgery, chemotherapy, or a combination of treatments may be recommended.  Consider joining a support group. This may help you learn to cope with the stress of having bladder cancer. This information is not intended to replace advice given to you by your health care provider. Make sure you discuss any questions you have with your health care provider. Document Revised: 09/03/2017 Document Reviewed: 08/25/2016 Elsevier Patient Education  2020 [employer]  

## 2020-07-15 NOTE — Progress Notes (Signed)
07/15/2020 10:40 AM   Darrell Rivas 08-25-1951 [id number]  Referring provider: Curlene Labrum, MD [place],  [place] [address]  Followup bladder cancer  HPI: Darrell Rivas is a 69yo here for followup after bladder tumor resection. Pathology TaG3. Voiding trial passed today. NO hematuria.  Pathology report reviewed with the patient.    PMH: Past Medical History:  Diagnosis Date  . Diabetes mellitus without complication ([place])   . Hypercholesteremia   . Hypothyroidism   . Kidney stones   . Sleep apnea   . Thyroid disease     Surgical History: Past Surgical History:  Procedure Laterality Date  . COLONOSCOPY    . CYSTOSCOPY W/ URETERAL STENT PLACEMENT Bilateral 07/08/2020   Procedure: CYSTOSCOPY WITH BILATERAL RETROGRADE PYELOGRAM/ AND LEFT URETERAL STENT PLACEMENT;  Surgeon: Cleon Gustin, MD;  Location: [facility];  Service: Urology;  Laterality: Bilateral;  . TRANSURETHRAL RESECTION OF BLADDER TUMOR N/A 07/08/2020   Procedure: TRANSURETHRAL RESECTION OF BLADDER TUMOR (TURBT);  Surgeon: Cleon Gustin, MD;  Location: [facility];  Service: Urology;  Laterality: N/A;    Home Medications:  Allergies as of 07/15/2020   No Known Allergies     Medication List       Accurate as of July 15, 2020 10:40 AM. If you have any questions, ask your nurse or doctor.        atorvastatin 20 MG tablet Commonly known as: LIPITOR Take 20 mg by mouth daily.   Euthyrox 125 MCG tablet Generic drug: levothyroxine Take 125 mcg by mouth daily before breakfast.   latanoprost 0.005 % ophthalmic solution Commonly known as: XALATAN Place 1 drop into both eyes at bedtime.   metFORMIN 500 MG tablet Commonly known as: GLUCOPHAGE Take by mouth 2 (two) times daily with a meal.   oxyCODONE-acetaminophen 5-325 MG tablet Commonly known as: Percocet Take 1 tablet by mouth every 4 (four) hours as needed for severe pain.       Allergies: No Known Allergies  Family  History: Family History  Problem Relation Age of Onset  . Cancer Mother   . Stroke Father   . Cancer Sister   . Diabetes Maternal Grandmother     Social History:  reports that he has never smoked. He has never used smokeless tobacco. He reports that he does not drink alcohol and does not use drugs.  ROS: All other review of systems were reviewed and are negative except what is noted above in HPI  Physical Exam: There were no vitals taken for this visit.  Constitutional:  Alert and oriented, No acute distress. HEENT: [place] AT, moist mucus membranes.  Trachea midline, no masses. Cardiovascular: No clubbing, cyanosis, or edema. Respiratory: Normal respiratory effort, no increased work of breathing. GI: Abdomen is soft, nontender, nondistended, no abdominal masses GU: No CVA tenderness.  Lymph: No cervical or inguinal lymphadenopathy. Skin: No rashes, bruises or suspicious lesions. Neurologic: Grossly intact, no focal deficits, moving all 4 extremities. Psychiatric: Normal mood and affect.  Laboratory Data: Lab Results  Component Value Date   WBC 6.4 07/05/2020   HGB 13.6 07/05/2020   HCT 41.5 07/05/2020   MCV 93.0 07/05/2020   PLT 185 07/05/2020    Lab Results  Component Value Date   CREATININE 0.94 04/29/2020    No results found for: PSA  No results found for: TESTOSTERONE  No results found for: HGBA1C  Urinalysis    Component Value Date/Time   APPEARANCEUR Clear 06/05/2020 1127  GLUCOSEU Trace (A) 06/05/2020 1127   BILIRUBINUR Negative 06/05/2020 1127   PROTEINUR Negative 06/05/2020 1127   NITRITE Negative 06/05/2020 1127   LEUKOCYTESUR Negative 06/05/2020 1127    Lab Results  Component Value Date   LABMICR Comment 06/05/2020    Pertinent Imaging:  No results found for this or any previous visit.  No results found for this or any previous visit.  No results found for this or any previous visit.  No results found for this or any previous  visit.  No results found for this or any previous visit.  No results found for this or any previous visit.  Results for orders placed during the hospital encounter of 05/20/20  CT HEMATURIA WORKUP  Narrative CLINICAL DATA:  Gross hematuria. Began over 6 weeks ago with history of uric acid calculi.  EXAM: CT ABDOMEN AND PELVIS WITHOUT AND WITH CONTRAST  TECHNIQUE: Multidetector CT imaging of the abdomen and pelvis was performed following the standard protocol before and following the bolus administration of intravenous contrast.  CONTRAST:  154mL OMNIPAQUE IOHEXOL 300 MG/ML  SOLN  COMPARISON:  June 28, 2019  FINDINGS: Lower chest: Lung bases are clear.  Hepatobiliary: Low-density hepatic parenchyma compatible with steatosis, lobular hepatic contours without focal, suspicious hepatic lesion. Portal vein is patent. Hepatic veins are patent.  Pancreas: Pancreas is normal.  Spleen: Granulomatous changes in the normal size spleen.  Adrenals/Urinary Tract: Adrenal glands are normal.  Nephrolithiasis on the LEFT 3-4 mm calculus in the lower pole LEFT kidney. No ureteral calculi. Urinary bladder is under distended limiting assessment. There is some nodularity however at the LEFT bladder base this is near the LEFT ureterovesicular orifice best seen on images 80 of series 3 and image 89 of series 11.  Also seen on delayed phase imaging is a small nodular filling defect (image 80, series 4), additionally seen on coronal dataset (image 86, series 15)  Stomach/Bowel: No acute gastrointestinal process. Colonic diverticulosis. Stable appearance of central mesenteric stranding and scattered small lymph nodes in the small bowel mesentery.  Vascular/Lymphatic: Normal caliber abdominal aorta no adenopathy in the retroperitoneum or in the upper abdomen. No pelvic adenopathy.  Reproductive: Prostate with mild heterogeneity. No pelvic adenopathy.  Other: Small fat containing  umbilical hernia.  Musculoskeletal: No acute bone finding. No destructive bone process. Spinal degenerative changes.  IMPRESSION: 1. Nodular filling defect at the LEFT bladder base with perceived enhancement, suspicious for small urothelial neoplasm. Cystoscopy is suggested for further evaluation. 2. Nephrolithiasis on the LEFT. 3. Hepatic steatosis. 4. Colonic diverticulosis. 5. Small fat containing umbilical hernia. 6. Aortic atherosclerosis.  These results will be called to the ordering clinician or representative by the Radiologist Assistant, and communication documented in the PACS or [employer].  Aortic Atherosclerosis (ICD10-I70.0).   Electronically Signed By: Zetta Bills M.D. On: 05/20/2020 16:20  No results found for this or any previous visit.   Assessment & Plan:   1. Bladder cancer -We discussed the natural hx of high grade bladder cancer and the followup regiment. We dsicussed the value of repeating the resection and BCG therapy. The patient will be scheduled for bladder tumor resection in 3-4 weeks then BCG therapy  No follow-ups on file.  Nicolette Bang, MD  [facility] [place]

## 2020-07-23 ENCOUNTER — Other Ambulatory Visit: Payer: Medicare Other | Admitting: Urology

## 2020-07-29 ENCOUNTER — Telehealth: Payer: Self-pay

## 2020-07-29 ENCOUNTER — Other Ambulatory Visit: Payer: Self-pay

## 2020-07-29 DIAGNOSIS — R310 Gross hematuria: Secondary | ICD-10-CM

## 2020-07-30 ENCOUNTER — Other Ambulatory Visit: Payer: Self-pay

## 2020-07-30 ENCOUNTER — Other Ambulatory Visit: Payer: Medicare Other

## 2020-07-30 DIAGNOSIS — R310 Gross hematuria: Secondary | ICD-10-CM | POA: Diagnosis not present

## 2020-07-30 LAB — URINALYSIS, ROUTINE W REFLEX MICROSCOPIC
Bilirubin, UA: NEGATIVE
Glucose, UA: NEGATIVE
Ketones, UA: NEGATIVE
Nitrite, UA: POSITIVE — AB
Specific Gravity, UA: 1.015 (ref 1.005–1.030)
Urobilinogen, Ur: 0.2 mg/dL (ref 0.2–1.0)
pH, UA: 5.5 (ref 5.0–7.5)

## 2020-07-30 LAB — MICROSCOPIC EXAMINATION
Renal Epithel, UA: NONE SEEN /hpf
WBC, UA: >30 /hpf — AB (ref 0–5)

## 2020-07-30 NOTE — Patient Instructions (Signed)
Darrell Rivas  07/30/2020     @PREFPERIOPPHARMACY @   Your procedure is scheduled on  08/05/2020.  Report to [facility] at  1200  P.M.  Call this number if you have problems the morning of surgery:  [phone]   Remember:  Do not eat or drink after midnight.                         Take these medicines the morning of surgery with A SIP OF WATER Euthyox.    Do not wear jewelry, make-up or nail polish.  Do not wear lotions, powders, or perfume. Please wear deodorant and brush your teeth.  Do not shave 48 hours prior to surgery.  Men may shave face and neck.  Do not bring valuables to the hospital.  [facility] is not responsible for any belongings or valuables.  Contacts, dentures or bridgework may not be worn into surgery.  Leave your suitcase in the car.  After surgery it may be brought to your room.  For patients admitted to the hospital, discharge time will be determined by your treatment team.  Patients discharged the day of surgery will not be allowed to drive home.   Name and phone number of your driver:   family Special instructions:   DO NOT smoke the morning of your procedure.  Please read over the following fact sheets that you were given. Anesthesia Post-op Instructions and Care and Recovery After Surgery       Transurethral Resection of Bladder Tumor, Care After This sheet gives you information about how to care for yourself after your procedure. Your health care provider may also give you more specific instructions. If you have problems or questions, contact your health care provider. What can I expect after the procedure? After the procedure, it is common to have:  A small amount of blood in your urine for up to 2 weeks.  Soreness or mild pain from your catheter. After your catheter is removed, you may have mild soreness, especially when urinating.  Pain in your lower abdomen. Follow these instructions at home: Medicines   Take  over-the-counter and prescription medicines only as told by your health care provider.  If you were prescribed an antibiotic medicine, take it as told by your health care provider. Do not stop taking the antibiotic even if you start to feel better.  Do not drive for 24 hours if you were given a sedative during your procedure.  Ask your health care provider if the medicine prescribed to you: ? Requires you to avoid driving or using heavy machinery. ? Can cause constipation. You may need to take these actions to prevent or treat constipation:  Take over-the-counter or prescription medicines.  Eat foods that are high in fiber, such as beans, whole grains, and fresh fruits and vegetables.  Limit foods that are high in fat and processed sugars, such as fried or sweet foods. Activity  Return to your normal activities as told by your health care provider. Ask your health care provider what activities are safe for you.  Do not lift anything that is heavier than 10 lb (4.5 kg), or the limit that you are told, until your health care provider says that it is safe.  Avoid intense physical activity for as long as told by your health care provider.  Rest as told by your health care provider.  Avoid sitting for a long time without  moving. Get up to take short walks every 1-2 hours. This is important to improve blood flow and breathing. Ask for help if you feel weak or unsteady. General instructions   Do not drink alcohol for as long as told by your health care provider. This is especially important if you are taking prescription pain medicines.  Do not take baths, swim, or use a hot tub until your health care provider approves. Ask your health care provider if you may take showers. You may only be allowed to take sponge baths.  If you have a catheter, follow instructions from your health care provider about caring for your catheter and your drainage bag.  Drink enough fluid to keep your urine  pale yellow.  Wear compression stockings as told by your health care provider. These stockings help to prevent blood clots and reduce swelling in your legs.  Keep all follow-up visits as told by your health care provider. This is important. ? You will need to be followed closely with regular checks of your bladder and urethra (cystoscopies) to make sure that the cancer does not come back. Contact a health care provider if:  You have pain that gets worse or does not improve with medicine.  You have blood in your urine for more than 2 weeks.  You have cloudy or bad-smelling urine.  You become constipated. Signs of constipation may include having: ? Fewer than three bowel movements in a week. ? Difficulty having a bowel movement. ? Stools that are dry, hard, or larger than normal.  You have a fever. Get help right away if:  You have: ? Severe pain. ? Bright red blood in your urine. ? Blood clots in your urine. ? A lot of blood in your urine.  Your catheter has been removed and you are not able to urinate.  You have a catheter in place and the catheter is not draining urine. Summary  After your procedure, it is common to have a small amount of blood in your urine, soreness or mild pain from your catheter, and pain in your lower abdomen.  Take over-the-counter and prescription medicines only as told by your health care provider.  Rest as told by your health care provider. Follow your health care provider's instructions about returning to normal activities. Ask what activities are safe for you.  If you have a catheter, follow instructions from your health care provider about caring for your catheter and your drainage bag.  Get help right away if you cannot urinate, you have severe pain, or you have bright red blood or blood clots in your urine. This information is not intended to replace advice given to you by your health care provider. Make sure you discuss any questions you have  with your health care provider. Document Revised: 04/21/2018 Document Reviewed: 04/21/2018 Elsevier Patient Education  2020 [place] Anesthesia, Adult, Care After This sheet gives you information about how to care for yourself after your procedure. Your health care provider may also give you more specific instructions. If you have problems or questions, contact your health care provider. What can I expect after the procedure? After the procedure, the following side effects are common:  Pain or discomfort at the IV site.  Nausea.  Vomiting.  Sore throat.  Trouble concentrating.  Feeling cold or chills.  Weak or tired.  Sleepiness and fatigue.  Soreness and body aches. These side effects can affect parts of the body that were not involved in surgery. Follow  these instructions at home:  For at least 24 hours after the procedure:  Have a responsible adult stay with you. It is important to have someone help care for you until you are awake and alert.  Rest as needed.  Do not: ? Participate in activities in which you could fall or become injured. ? Drive. ? Use heavy machinery. ? Drink alcohol. ? Take sleeping pills or medicines that cause drowsiness. ? Make important decisions or sign legal documents. ? Take care of children on your own. Eating and drinking  Follow any instructions from your health care provider about eating or drinking restrictions.  When you feel hungry, start by eating small amounts of foods that are soft and easy to digest (bland), such as toast. Gradually return to your regular diet.  Drink enough fluid to keep your urine pale yellow.  If you vomit, rehydrate by drinking water, juice, or clear broth. General instructions  If you have sleep apnea, surgery and certain medicines can increase your risk for breathing problems. Follow instructions from your health care provider about wearing your sleep device: ? Anytime you are sleeping,  including during daytime naps. ? While taking prescription pain medicines, sleeping medicines, or medicines that make you drowsy.  Return to your normal activities as told by your health care provider. Ask your health care provider what activities are safe for you.  Take over-the-counter and prescription medicines only as told by your health care provider.  If you smoke, do not smoke without supervision.  Keep all follow-up visits as told by your health care provider. This is important. Contact a health care provider if:  You have nausea or vomiting that does not get better with medicine.  You cannot eat or drink without vomiting.  You have pain that does not get better with medicine.  You are unable to pass urine.  You develop a skin rash.  You have a fever.  You have redness around your IV site that gets worse. Get help right away if:  You have difficulty breathing.  You have chest pain.  You have blood in your urine or stool, or you vomit blood. Summary  After the procedure, it is common to have a sore throat or nausea. It is also common to feel tired.  Have a responsible adult stay with you for the first 24 hours after general anesthesia. It is important to have someone help care for you until you are awake and alert.  When you feel hungry, start by eating small amounts of foods that are soft and easy to digest (bland), such as toast. Gradually return to your regular diet.  Drink enough fluid to keep your urine pale yellow.  Return to your normal activities as told by your health care provider. Ask your health care provider what activities are safe for you. This information is not intended to replace advice given to you by your health care provider. Make sure you discuss any questions you have with your health care provider. Document Revised: 09/24/2017 Document Reviewed: 05/07/2017 Elsevier Patient Education  [place].

## 2020-08-01 ENCOUNTER — Other Ambulatory Visit: Payer: Self-pay

## 2020-08-01 ENCOUNTER — Encounter (HOSPITAL_COMMUNITY)
Admission: RE | Admit: 2020-08-01 | Discharge: 2020-08-01 | Disposition: A | Discharge status: Home / Self Care | Payer: Medicare Other | Source: Ambulatory Visit | Attending: Urology | Admitting: Urology

## 2020-08-01 ENCOUNTER — Other Ambulatory Visit (HOSPITAL_COMMUNITY)
Admission: RE | Admit: 2020-08-01 | Discharge: 2020-08-01 | Disposition: A | Discharge status: Home / Self Care | Payer: Medicare Other | Source: Ambulatory Visit | Attending: Urology | Admitting: Urology

## 2020-08-01 DIAGNOSIS — Z20822 Contact with and (suspected) exposure to covid-19: Secondary | ICD-10-CM | POA: Diagnosis not present

## 2020-08-01 DIAGNOSIS — Z01812 Encounter for preprocedural laboratory examination: Secondary | ICD-10-CM | POA: Insufficient documentation

## 2020-08-01 LAB — BASIC METABOLIC PANEL
Anion gap: 10 (ref 5–15)
BUN: 16 mg/dL (ref 8–23)
CO2: 26 mmol/L (ref 22–32)
Calcium: 9.3 mg/dL (ref 8.9–10.3)
Chloride: 103 mmol/L (ref 98–111)
Creatinine, Ser: 1 mg/dL (ref 0.61–1.24)
GFR, Estimated: >60 mL/min (ref >60)
Glucose, Bld: 81 mg/dL (ref 70–99)
Potassium: 4.1 mmol/L (ref 3.5–5.1)
Sodium: 139 mmol/L (ref 135–145)

## 2020-08-02 LAB — HEMOGLOBIN A1C
Hgb A1c MFr Bld: 6.4 % — ABNORMAL HIGH (ref 4.8–5.6)
Mean Plasma Glucose: 137 mg/dL

## 2020-08-02 LAB — SARS CORONAVIRUS 2 (TAT 6-24 HRS): SARS Coronavirus 2: NEGATIVE

## 2020-08-03 DIAGNOSIS — R7303 Prediabetes: Secondary | ICD-10-CM | POA: Diagnosis not present

## 2020-08-03 DIAGNOSIS — E7849 Other hyperlipidemia: Secondary | ICD-10-CM | POA: Diagnosis not present

## 2020-08-03 DIAGNOSIS — I10 Essential (primary) hypertension: Secondary | ICD-10-CM | POA: Diagnosis not present

## 2020-08-03 DIAGNOSIS — E039 Hypothyroidism, unspecified: Secondary | ICD-10-CM | POA: Diagnosis not present

## 2020-08-03 LAB — URINE CULTURE

## 2020-08-05 ENCOUNTER — Ambulatory Visit (HOSPITAL_COMMUNITY)
Admission: RE | Admit: 2020-08-05 | Discharge: 2020-08-05 | Disposition: A | Discharge status: Home / Self Care | Payer: Medicare Other | Attending: Urology | Admitting: Urology

## 2020-08-05 ENCOUNTER — Ambulatory Visit (HOSPITAL_COMMUNITY): Payer: Medicare Other | Admitting: Certified Registered"

## 2020-08-05 ENCOUNTER — Encounter (HOSPITAL_COMMUNITY)
Admission: RE | Disposition: A | Discharge status: Home / Self Care | Payer: Self-pay | Source: Home / Self Care | Attending: Urology

## 2020-08-05 ENCOUNTER — Encounter (HOSPITAL_COMMUNITY): Payer: Self-pay | Admitting: Urology

## 2020-08-05 DIAGNOSIS — D494 Neoplasm of unspecified behavior of bladder: Secondary | ICD-10-CM | POA: Diagnosis not present

## 2020-08-05 DIAGNOSIS — I700 Atherosclerosis of aorta: Secondary | ICD-10-CM | POA: Insufficient documentation

## 2020-08-05 DIAGNOSIS — R3129 Other microscopic hematuria: Secondary | ICD-10-CM | POA: Diagnosis not present

## 2020-08-05 DIAGNOSIS — Z8551 Personal history of malignant neoplasm of bladder: Secondary | ICD-10-CM | POA: Diagnosis not present

## 2020-08-05 DIAGNOSIS — Z823 Family history of stroke: Secondary | ICD-10-CM | POA: Insufficient documentation

## 2020-08-05 DIAGNOSIS — N3090 Cystitis, unspecified without hematuria: Secondary | ICD-10-CM | POA: Diagnosis not present

## 2020-08-05 DIAGNOSIS — C672 Malignant neoplasm of lateral wall of bladder: Secondary | ICD-10-CM

## 2020-08-05 DIAGNOSIS — K760 Fatty (change of) liver, not elsewhere classified: Secondary | ICD-10-CM | POA: Insufficient documentation

## 2020-08-05 DIAGNOSIS — D303 Benign neoplasm of bladder: Secondary | ICD-10-CM | POA: Insufficient documentation

## 2020-08-05 DIAGNOSIS — Z809 Family history of malignant neoplasm, unspecified: Secondary | ICD-10-CM | POA: Diagnosis not present

## 2020-08-05 DIAGNOSIS — G4730 Sleep apnea, unspecified: Secondary | ICD-10-CM | POA: Insufficient documentation

## 2020-08-05 DIAGNOSIS — N200 Calculus of kidney: Secondary | ICD-10-CM | POA: Diagnosis not present

## 2020-08-05 DIAGNOSIS — E039 Hypothyroidism, unspecified: Secondary | ICD-10-CM | POA: Diagnosis not present

## 2020-08-05 DIAGNOSIS — N3289 Other specified disorders of bladder: Secondary | ICD-10-CM | POA: Diagnosis not present

## 2020-08-05 DIAGNOSIS — Z87442 Personal history of urinary calculi: Secondary | ICD-10-CM | POA: Insufficient documentation

## 2020-08-05 DIAGNOSIS — Z833 Family history of diabetes mellitus: Secondary | ICD-10-CM | POA: Diagnosis not present

## 2020-08-05 HISTORY — PX: TRANSURETHRAL RESECTION OF BLADDER TUMOR: SHX2575

## 2020-08-05 HISTORY — PX: CYSTOSCOPY: SHX5120

## 2020-08-05 LAB — GLUCOSE, CAPILLARY
Glucose-Capillary: 100 mg/dL — ABNORMAL HIGH (ref 70–99)
Glucose-Capillary: 101 mg/dL — ABNORMAL HIGH (ref 70–99)

## 2020-08-05 SURGERY — CYSTOSCOPY
Anesthesia: General | Site: Ureter

## 2020-08-05 MED ORDER — PROPOFOL 10 MG/ML IV BOLUS
INTRAVENOUS | Status: DC | PRN
  Administered 2020-08-05: 150 mg via INTRAVENOUS

## 2020-08-05 MED ORDER — SUGAMMADEX SODIUM 200 MG/2ML IV SOLN
INTRAVENOUS | Status: DC | PRN
  Administered 2020-08-05: 200 mg via INTRAVENOUS

## 2020-08-05 MED ORDER — OXYCODONE-ACETAMINOPHEN 5-325 MG PO TABS
1.0000 | ORAL_TABLET | ORAL | 0 refills | Status: DC | PRN
Start: 2020-08-05 — End: 2020-12-16

## 2020-08-05 MED ORDER — PROPOFOL 10 MG/ML IV BOLUS
INTRAVENOUS | Status: AC
  Filled 2020-08-05: qty 20

## 2020-08-05 MED ORDER — ROCURONIUM BROMIDE 100 MG/10ML IV SOLN
INTRAVENOUS | Status: DC | PRN
  Administered 2020-08-05: 40 mg via INTRAVENOUS

## 2020-08-05 MED ORDER — FENTANYL CITRATE (PF) 100 MCG/2ML IJ SOLN
INTRAMUSCULAR | Status: DC | PRN
  Administered 2020-08-05: 50 ug via INTRAVENOUS

## 2020-08-05 MED ORDER — CEFAZOLIN SODIUM-DEXTROSE 2-4 GM/100ML-% IV SOLN
2.0000 g | INTRAVENOUS | Status: AC
  Administered 2020-08-05: 2 g via INTRAVENOUS

## 2020-08-05 MED ORDER — ONDANSETRON HCL 4 MG/2ML IJ SOLN
INTRAMUSCULAR | Status: AC
  Filled 2020-08-05: qty 2

## 2020-08-05 MED ORDER — ROCURONIUM BROMIDE 10 MG/ML (PF) SYRINGE
PREFILLED_SYRINGE | INTRAVENOUS | Status: AC
  Filled 2020-08-05: qty 10

## 2020-08-05 MED ORDER — MIDAZOLAM HCL 5 MG/5ML IJ SOLN
INTRAMUSCULAR | Status: DC | PRN
  Administered 2020-08-05: 1 mg via INTRAVENOUS

## 2020-08-05 MED ORDER — ONDANSETRON HCL 4 MG/2ML IJ SOLN
INTRAMUSCULAR | Status: DC | PRN
  Administered 2020-08-05: 4 mg via INTRAVENOUS

## 2020-08-05 MED ORDER — CEFAZOLIN SODIUM-DEXTROSE 2-4 GM/100ML-% IV SOLN
INTRAVENOUS | Status: AC
  Filled 2020-08-05: qty 100

## 2020-08-05 MED ORDER — ONDANSETRON HCL 4 MG/2ML IJ SOLN: 4.0000 mg | Freq: Once | INTRAMUSCULAR | Status: DC | PRN

## 2020-08-05 MED ORDER — LIDOCAINE 2% (20 MG/ML) 5 ML SYRINGE
INTRAMUSCULAR | Status: DC | PRN
  Administered 2020-08-05: 80 mg via INTRAVENOUS

## 2020-08-05 MED ORDER — SODIUM CHLORIDE 0.9 % IR SOLN
Status: DC | PRN
  Administered 2020-08-05: 3000 mL via INTRAVESICAL

## 2020-08-05 MED ORDER — CHLORHEXIDINE GLUCONATE 0.12 % MT SOLN: 15.0000 mL | Freq: Once | OROMUCOSAL | Status: DC

## 2020-08-05 MED ORDER — ORAL CARE MOUTH RINSE: 15.0000 mL | Freq: Once | OROMUCOSAL | Status: DC

## 2020-08-05 MED ORDER — LACTATED RINGERS IV SOLN: INTRAVENOUS | Status: DC

## 2020-08-05 MED ORDER — STERILE WATER FOR IRRIGATION IR SOLN
Status: DC | PRN
  Administered 2020-08-05: 1000 mL

## 2020-08-05 MED ORDER — MIDAZOLAM HCL 2 MG/2ML IJ SOLN
INTRAMUSCULAR | Status: AC
  Filled 2020-08-05: qty 2

## 2020-08-05 MED ORDER — FENTANYL CITRATE (PF) 100 MCG/2ML IJ SOLN: 25.0000 ug | INTRAMUSCULAR | Status: DC | PRN

## 2020-08-05 MED ORDER — FENTANYL CITRATE (PF) 250 MCG/5ML IJ SOLN
INTRAMUSCULAR | Status: AC
Start: 2020-08-05 — End: ?
  Filled 2020-08-05: qty 5

## 2020-08-05 SURGICAL SUPPLY — 32 items
BAG DRAIN URO TABLE W/ADPT NS (BAG) ×4 IMPLANT
BAG DRN 8 ADPR NS SKTRN CSTL (BAG) ×2
BAG DRN RND TRDRP ANRFLXCHMBR (UROLOGICAL SUPPLIES) ×2
BAG HAMPER (MISCELLANEOUS) ×4 IMPLANT
BAG URINE DRAIN 2000ML AR STRL (UROLOGICAL SUPPLIES) ×4 IMPLANT
CATH FOLEY 3WAY 30CC 22F (CATHETERS) ×4 IMPLANT
CATH FOLEY LATEX FREE 22FR (CATHETERS)
CATH FOLEY LF 22FR (CATHETERS) IMPLANT
CLOTH BEACON ORANGE TIMEOUT ST (SAFETY) ×4 IMPLANT
ELECT LOOP 22F BIPOLAR SML (ELECTROSURGICAL) ×4
ELECTRODE LOOP 22F BIPOLAR SML (ELECTROSURGICAL) ×2 IMPLANT
GLOVE BIO SURGEON STRL SZ8 (GLOVE) ×4 IMPLANT
GLOVE BIOGEL M 7.0 STRL (GLOVE) ×4 IMPLANT
GLOVE BIOGEL PI IND STRL 7.0 (GLOVE) ×4 IMPLANT
GLOVE BIOGEL PI IND STRL 7.5 (GLOVE) ×2 IMPLANT
GLOVE BIOGEL PI INDICATOR 7.0 (GLOVE) ×4
GLOVE BIOGEL PI INDICATOR 7.5 (GLOVE) ×2
GOWN STRL REUS W/ TWL XL LVL3 (GOWN DISPOSABLE) ×2 IMPLANT
GOWN STRL REUS W/TWL LRG LVL3 (GOWN DISPOSABLE) ×8 IMPLANT
GOWN STRL REUS W/TWL XL LVL3 (GOWN DISPOSABLE) ×8 IMPLANT
IV NS IRRIG 3000ML ARTHROMATIC (IV SOLUTION) ×4 IMPLANT
KIT TURNOVER CYSTO (KITS) ×4 IMPLANT
MANIFOLD NEPTUNE II (INSTRUMENTS) ×4 IMPLANT
PACK CYSTO (CUSTOM PROCEDURE TRAY) ×4 IMPLANT
PAD ARMBOARD 7.5X6 YLW CONV (MISCELLANEOUS) ×4 IMPLANT
PLUG CATH AND CAP STER (CATHETERS) ×4 IMPLANT
SYR 30ML LL (SYRINGE) ×4 IMPLANT
SYR TOOMEY IRRIG 70ML (MISCELLANEOUS) ×4
SYRINGE TOOMEY IRRIG 70ML (MISCELLANEOUS) ×2 IMPLANT
TOWEL NATURAL 4PK STERILE (DISPOSABLE) ×4 IMPLANT
TOWEL OR 17X26 4PK STRL BLUE (TOWEL DISPOSABLE) ×4 IMPLANT
WATER STERILE IRR 500ML POUR (IV SOLUTION) ×4 IMPLANT

## 2020-08-05 NOTE — Op Note (Signed)
Preoperative diagnosis: Bladder Tumor  Postop diagnosis: Same  Procedure: 1.  Cystoscopy 2.Transurethral resection of bladder tumor, small 5. Left 6x26 JJ ureteral Stent removal  Attending: Nicolette Bang  Anesthesia: General  Estimated blood loss: minimal  Drains: 1. 22 French Foley catheter  Specimens: Bladder tumor  Antibiotics: Ancef  Findings: 1cm sessile tumor at previous resection site.  Ureteral orifices in normal anatomic location.   Indications: Patient is a 69 year old with a history of grade bladder cancer who underwent resection 1 month ago.   After discussing treatment options patient decided to proceed with transurethral resection of bladder tumor  Procedure in detail: Prior to procedure consetn was obtained. Patient was brought to the operating room and briefing was done sure correct patient, correct procedure, correct site.  General anesthesia was in administered patient was placed in the dorsal lithotomy position.  The rigid 46 French cystoscope was passed urethra and bladder.  Bladder was inspected masses or lesions and we noted a 1 cm lesion at the previous resection site.   Removed the cystoscope and placed a 84 French resectoscope in the bladder.  Using bipolar electrocautery were then removed the bladder tumor.    We then removed the fragments and sent them for pathology.  To obtain hemostasis we then cauterized the bed of the tumor. We then removed the left ureteral stent. Once good hemostasis was noted the bladder was then drained and a 22 French Foley catheter was placed. This concluded the procedure which resulted by the patient.  Complications: None  Condition: Stable,  extubated, transferred to PACU.  Plan: the patient is to be discharged home and followup in 1 week for a voiding trial and pathology discussion

## 2020-08-05 NOTE — Interval H&P Note (Signed)
History and Physical Interval Note:  08/05/2020 8:20 AM  Darrell Rivas  has presented today for surgery, with the diagnosis of bladder cancer.  The various methods of treatment have been discussed with the patient and family. After consideration of risks, benefits and other options for treatment, the patient has consented to  Procedure(s) with comments: CYSTOSCOPY (N/A) - pt knows to arrive at 7:00 [place] (TURBT) (N/A) as a surgical intervention.  The patient's history has been reviewed, patient examined, no change in status, stable for surgery.  I have reviewed the patient's chart and labs.  Questions were answered to the patient's satisfaction.     Nicolette Bang

## 2020-08-05 NOTE — Anesthesia Procedure Notes (Signed)
Procedure Name: Intubation Date/Time: 08/05/2020 8:58 AM Performed by: Gwyndolyn Saxon, CRNA Pre-anesthesia Checklist: Patient identified, Emergency Drugs available, Suction available and Patient being monitored Patient Re-evaluated:Patient Re-evaluated prior to induction Oxygen Delivery Method: Circle system utilized Preoxygenation: Pre-oxygenation with 100% oxygen Induction Type: IV induction Ventilation: Mask ventilation without difficulty Laryngoscope Size: Miller and 2 Grade View: Grade III Tube type: Oral Tube size: 7.0 mm Number of attempts: 1 Airway Equipment and Method: Patient positioned with wedge pillow and Stylet Placement Confirmation: positive ETCO2 and breath sounds checked- equal and bilateral Secured at: 21 cm Tube secured with: Tape Dental Injury: Teeth and Oropharynx as per pre-operative assessment  Difficulty Due To: Difficulty was anticipated, Difficult Airway- due to reduced neck mobility and Difficult Airway- due to limited oral opening Comments: Pt easy mask airway without oral airway device; very limited neck mobility;  grade III view. Able to pass ETT on first attempt

## 2020-08-05 NOTE — Transfer of Care (Signed)
Immediate Anesthesia Transfer of Care Note  Patient: Darrell Rivas  Procedure(s) Performed: CYSTOSCOPY with REMOVAL OF LEFT URETERAL STENT (Left Ureter) CYSTOSCOPY WITH TRANSURETHRAL RESECTION OF BLADDER TUMOR (TURBT) (N/A Bladder)  Patient Location: PACU  Anesthesia Type:General  Level of Consciousness: awake, alert  and patient cooperative  Airway & Oxygen Therapy: Patient Spontanous Breathing and Patient connected to nasal cannula oxygen  Post-op Assessment: Report given to RN and Post -op Vital signs reviewed and stable  Post vital signs: Reviewed and stable  Last Vitals:  Vitals Value Taken Time  BP 120/82 08/05/20 0934  Temp    Pulse 73 08/05/20 0935  Resp 20 08/05/20 0935  SpO2 100 % 08/05/20 0935  Vitals shown include unvalidated device data.  Last Pain: There were no vitals filed for this visit.       Complications: No complications documented.

## 2020-08-05 NOTE — Anesthesia Postprocedure Evaluation (Signed)
Anesthesia Post Note  Patient: Darrell Rivas  Procedure(s) Performed: CYSTOSCOPY with REMOVAL OF LEFT URETERAL STENT (Left Ureter) CYSTOSCOPY WITH TRANSURETHRAL RESECTION OF BLADDER TUMOR (TURBT) (N/A Bladder)  Patient location during evaluation: Phase II Anesthesia Type: General Level of consciousness: awake Pain management: pain level controlled Vital Signs Assessment: post-procedure vital signs reviewed and stable Respiratory status: spontaneous breathing Cardiovascular status: blood pressure returned to baseline Anesthetic complications: no   No complications documented.   Last Vitals:  Vitals:   08/05/20 1045 08/05/20 1050  BP: 129/87 122/83  Pulse: 71 71  Resp: 18 20  Temp:  36.7 C  SpO2: 100% 100%    Last Pain:  Vitals:   08/05/20 1050  TempSrc: Oral  PainSc: 0-No pain                 Louann Sjogren

## 2020-08-05 NOTE — Discharge Instructions (Signed)
Indwelling Urinary Catheter Care, Adult °An indwelling urinary catheter is a thin tube that is put into your bladder. The tube helps to drain pee (urine) out of your body. The tube goes in through your urethra. Your urethra is where pee comes out of your body. Your pee will come out through the catheter, then it will go into a bag (drainage bag). °Take good care of your catheter so it will work well. °How to wear your catheter and bag °Supplies needed °· Sticky tape (adhesive tape) or a leg strap. °· Alcohol wipe or soap and water (if you use tape). °· A clean towel (if you use tape). °· Large overnight bag. °· Smaller bag (leg bag). °Wearing your catheter °Attach your catheter to your leg with tape or a leg strap. °· Make sure the catheter is not pulled tight. °· If a leg strap gets wet, take it off and put on a dry strap. °· If you use tape to hold the bag on your leg: °1. Use an alcohol wipe or soap and water to wash your skin where the tape made it sticky before. °2. Use a clean towel to pat-dry that skin. °3. Use new tape to make the bag stay on your leg. °Wearing your bags °You should have been given a large overnight bag. °· You may wear the overnight bag in the day or night. °· Always have the overnight bag lower than your bladder.  Do not let the bag touch the floor. °· Before you go to sleep, put a clean plastic bag in a wastebasket. Then hang the overnight bag inside the wastebasket. °You should also have a smaller leg bag that fits under your clothes. °· Always wear the leg bag below your knee. °· Do not wear your leg bag at night. °How to care for your skin and catheter °Supplies needed °· A clean washcloth. °· Water and mild soap. °· A clean towel. °Caring for your skin and catheter ° °  ° °· Clean the skin around your catheter every day: °1. Wash your hands with soap and water. °2. Wet a clean washcloth in warm water and mild soap. °3. Clean the skin around your urethra. °§ If you are  male: °§ Gently spread the folds of skin around your vagina (labia). °§ With the washcloth in your other hand, wipe the inner side of your labia on each side. Wipe from front to back. °§ If you are male: °§ Pull back any skin that covers the end of your penis (foreskin). °§ With the washcloth in your other hand, wipe your penis in small circles. Start wiping at the tip of your penis, then move away from the catheter. °§ Move the foreskin back in place, if needed. °4. With your free hand, hold the catheter close to where it goes into your body. °§ Keep holding the catheter during cleaning so it does not get pulled out. °5. With the washcloth in your other hand, clean the catheter. °§ Only wipe downward on the catheter. °§ Do not wipe upward toward your body. Doing this may push germs into your urethra and cause infection. °6. Use a clean towel to pat-dry the catheter and the skin around it. Make sure to wipe off all soap. °7. Wash your hands with soap and water. °· Shower every day. Do not take baths. °· Do not use cream, ointment, or lotion on the area where the catheter goes into your body, unless your doctor tells you   to. °· Do not use powders, sprays, or lotions on your genital area. °· Check your skin around the catheter every day for signs of infection. Check for: °? Redness, swelling, or pain. °? Fluid or blood. °? Warmth. °? Pus or a bad smell. °How to empty the bag °Supplies needed °· Rubbing alcohol. °· Gauze pad or cotton ball. °· Tape or a leg strap. °Emptying the bag °Pour the pee out of your bag when it is ?-½ full, or at least 2-3 times a day. Do this for your overnight bag and your leg bag. °1. Wash your hands with soap and water. °2. Separate (detach) the bag from your leg. °3. Hold the bag over the toilet or a clean pail. Keep the bag lower than your hips and bladder. This is so the pee (urine) does not go back into the tube. °4. Open the pour spout. It is at the bottom of the bag. °5. Empty the  pee into the toilet or pail. Do not let the pour spout touch any surface. °6. Put rubbing alcohol on a gauze pad or cotton ball. °7. Use the gauze pad or cotton ball to clean the pour spout. °8. Close the pour spout. °9. Attach the bag to your leg with tape or a leg strap. °10. Wash your hands with soap and water. °Follow instructions for cleaning the drainage bag: °· From the product maker. °· As told by your doctor. °How to change the bag °Supplies needed °· Alcohol wipes. °· A clean bag. °· Tape or a leg strap. °Changing the bag °Replace your bag when it starts to leak, smell bad, or look dirty. °1. Wash your hands with soap and water. °2. Separate the dirty bag from your leg. °3. Pinch the catheter with your fingers so that pee does not spill out. °4. Separate the catheter tube from the bag tube where these tubes connect (at the connection valve). Do not let the tubes touch any surface. °5. Clean the end of the catheter tube with an alcohol wipe. Use a different alcohol wipe to clean the end of the bag tube. °6. Connect the catheter tube to the tube of the clean bag. °7. Attach the clean bag to your leg with tape or a leg strap. Do not make the bag tight on your leg. °8. Wash your hands with soap and water. °General rules ° °· Never pull on your catheter. Never try to take it out. Doing that can hurt you. °· Always wash your hands before and after you touch your catheter or bag. Use a mild, fragrance-free soap. If you do not have soap and water, use hand sanitizer. °· Always make sure there are no twists or bends (kinks) in the catheter tube. °· Always make sure there are no leaks in the catheter or bag. °· Drink enough fluid to keep your pee pale yellow. °· Do not take baths, swim, or use a hot tub. °· If you are male, wipe from front to back after you poop (have a bowel movement). °Contact a doctor if: °· Your pee is cloudy. °· Your pee smells worse than usual. °· Your catheter gets clogged. °· Your catheter  leaks. °· Your bladder feels full. °Get help right away if: °· You have redness, swelling, or pain where the catheter goes into your body. °· You have fluid, blood, pus, or a bad smell coming from the area where the catheter goes into your body. °· Your skin feels warm where   the catheter goes into your body. °· You have a fever. °· You have pain in your: °? Belly (abdomen). °? Legs. °? Lower back. °? Bladder. °· You see blood in the catheter. °· Your pee is pink or red. °· You feel sick to your stomach (nauseous). °· You throw up (vomit). °· You have chills. °· Your pee is not draining into the bag. °· Your catheter gets pulled out. °Summary °· An indwelling urinary catheter is a thin tube that is placed into the bladder to help drain pee (urine) out of the body. °· The catheter is placed into the part of the body that drains pee from the bladder (urethra). °· Taking good care of your catheter will keep it working properly and help prevent problems. °· Always wash your hands before and after touching your catheter or bag. °· Never pull on your catheter or try to take it out. °This information is not intended to replace advice given to you by your health care provider. Make sure you discuss any questions you have with your health care provider. °Document Revised: 01/13/2019 Document Reviewed: 05/07/2017 °Elsevier Patient Education © 2020 [employer] ° ° ° ° °General Anesthesia, Adult, Care After °This sheet gives you information about how to care for yourself after your procedure. Your health care provider may also give you more specific instructions. If you have problems or questions, contact your health care provider. °What can I expect after the procedure? °After the procedure, the following side effects are common: °· Pain or discomfort at the IV site. °· Nausea. °· Vomiting. °· Sore throat. °· Trouble concentrating. °· Feeling cold or chills. °· Weak or tired. °· Sleepiness and fatigue. °· Soreness and body  aches. These side effects can affect parts of the body that were not involved in surgery. °Follow these instructions at home: ° °For at least 24 hours after the procedure: °· Have a responsible adult stay with you. It is important to have someone help care for you until you are awake and alert. °· Rest as needed. °· Do not: °? Participate in activities in which you could fall or become injured. °? Drive. °? Use heavy machinery. °? Drink alcohol. °? Take sleeping pills or medicines that cause drowsiness. °? Make important decisions or sign legal documents. °? Take care of children on your own. °Eating and drinking °· Follow any instructions from your health care provider about eating or drinking restrictions. °· When you feel hungry, start by eating small amounts of foods that are soft and easy to digest (bland), such as toast. Gradually return to your regular diet. °· Drink enough fluid to keep your urine pale yellow. °· If you vomit, rehydrate by drinking water, juice, or clear broth. °General instructions °· If you have sleep apnea, surgery and certain medicines can increase your risk for breathing problems. Follow instructions from your health care provider about wearing your sleep device: °? Anytime you are sleeping, including during daytime naps. °? While taking prescription pain medicines, sleeping medicines, or medicines that make you drowsy. °· Return to your normal activities as told by your health care provider. Ask your health care provider what activities are safe for you. °· Take over-the-counter and prescription medicines only as told by your health care provider. °· If you smoke, do not smoke without supervision. °· Keep all follow-up visits as told by your health care provider. This is important. °Contact a health care provider if: °· You have nausea or vomiting that   does not get better with medicine. °· You cannot eat or drink without vomiting. °· You have pain that does not get better with  medicine. °· You are unable to pass urine. °· You develop a skin rash. °· You have a fever. °· You have redness around your IV site that gets worse. °Get help right away if: °· You have difficulty breathing. °· You have chest pain. °· You have blood in your urine or stool, or you vomit blood. °Summary °· After the procedure, it is common to have a sore throat or nausea. It is also common to feel tired. °· Have a responsible adult stay with you for the first 24 hours after general anesthesia. It is important to have someone help care for you until you are awake and alert. °· When you feel hungry, start by eating small amounts of foods that are soft and easy to digest (bland), such as toast. Gradually return to your regular diet. °· Drink enough fluid to keep your urine pale yellow. °· Return to your normal activities as told by your health care provider. Ask your health care provider what activities are safe for you. °This information is not intended to replace advice given to you by your health care provider. Make sure you discuss any questions you have with your health care provider. °Document Revised: 09/24/2017 Document Reviewed: 05/07/2017 °Elsevier Patient Education © 2020 [employer] Inc. ° °

## 2020-08-05 NOTE — Anesthesia Preprocedure Evaluation (Signed)
Anesthesia Evaluation  Patient identified by MRN, date of birth, ID band Patient awake    Reviewed: Allergy & Precautions, H&P , NPO status , Patient's Chart, lab work & pertinent test results, reviewed documented beta blocker date and time   Airway Mallampati: II  TM Distance: >3 FB Neck ROM: full    Dental no notable dental hx. (+) Teeth Intact   Pulmonary sleep apnea ,    Pulmonary exam normal breath sounds clear to auscultation       Cardiovascular Exercise Tolerance: Good negative cardio ROS   Rhythm:regular Rate:Normal     Neuro/Psych negative neurological ROS  negative psych ROS   GI/Hepatic negative GI ROS, Neg liver ROS,   Endo/Other  diabetesHypothyroidism   Renal/GU Renal disease  negative genitourinary   Musculoskeletal   Abdominal   Peds  Hematology negative hematology ROS (+)   Anesthesia Other Findings   Reproductive/Obstetrics negative OB ROS                             Anesthesia Physical  Anesthesia Plan  ASA: II  Anesthesia Plan: General   Post-op Pain Management:    Induction:   PONV Risk Score and Plan:   Airway Management Planned:   Additional Equipment:   Intra-op Plan:   Post-operative Plan:   Informed Consent: I have reviewed the patients History and Physical, chart, labs and discussed the procedure including the risks, benefits and alternatives for the proposed anesthesia with the patient or authorized representative who has indicated his/her understanding and acceptance.     Dental Advisory Given  Plan Discussed with: CRNA  Anesthesia Plan Comments:         Anesthesia Quick Evaluation

## 2020-08-05 NOTE — Interval H&P Note (Signed)
History and Physical Interval Note:  08/05/2020 8:25 AM  Darrell Rivas  has presented today for surgery, with the diagnosis of bladder cancer.  The various methods of treatment have been discussed with the patient and family. After consideration of risks, benefits and other options for treatment, the patient has consented to  Procedure(s) with comments: CYSTOSCOPY (N/A) - pt knows to arrive at 7:00 [place] (TURBT) (N/A) as a surgical intervention.  The patient's history has been reviewed, patient examined, no change in status, stable for surgery.  I have reviewed the patient's chart and labs.  Questions were answered to the patient's satisfaction.     Nicolette Bang

## 2020-08-06 ENCOUNTER — Encounter (HOSPITAL_COMMUNITY): Payer: Self-pay | Admitting: Urology

## 2020-08-06 LAB — SURGICAL PATHOLOGY

## 2020-08-12 ENCOUNTER — Encounter: Payer: Self-pay | Admitting: Urology

## 2020-08-12 ENCOUNTER — Other Ambulatory Visit: Payer: Self-pay

## 2020-08-12 ENCOUNTER — Ambulatory Visit (INDEPENDENT_AMBULATORY_CARE_PROVIDER_SITE_OTHER): Payer: Medicare Other | Admitting: Urology

## 2020-08-12 VITALS — BP 145/76 | HR 97 | Temp 98.3°F | Ht 67.0 in | Wt 230.0 lb

## 2020-08-12 DIAGNOSIS — C672 Malignant neoplasm of lateral wall of bladder: Secondary | ICD-10-CM | POA: Diagnosis not present

## 2020-08-12 NOTE — Patient Instructions (Addendum)
Patient Education: (BCG) Into the Bladder (Intravesical Chemotherapy)  BCG is a vaccine which is used to prevent tuberculosis (TB).  But it's also a helpful treatment for some early bladder cancers.  When BCG goes directly into the bladder the treatment is described as intravesical.  BCG is a type of immunotherapy.  Immunotherapy stimulates the body's immune system to destroy cancer cells.  How it's given BCG treatment is given to you in an outpatient setting.  It takes a few minutes to administer and you can go home as soon as it's finished.  It might be a good idea to ask someone to bring you, particularly the fist time.  Unlike chemotherapy into the bladder, BCG treatment is never given immediately after surgery to remove bladder tumors.  There needs to be a delay usually of at least two weeks after surgery, before you can have it.  You won't be given treatment with BCG if you are unwell or have an infection in your urine.  You're usually asked to limit the amount you drink before your treatment.  This will help to increase the concentration of BCG in your bladder.  Drinking too much before your treatment may make your bladder feel uncomfortably full.  If you normally take water tablets (diuretics) take them later in the day after your treatment.  Your nurse or doctor will give you more advise about preparing for your treatment.  You will have a small tube (catheter) placed into your bladder.  Your doctor will then put the liquid vaccine directly into your bladder through the catheter and remove the catheter.  You will need to hold your urine for two hours afterwards.  This can be difficult but it's to give the treatment time to work.  You can walk around during this time.  When the treatment is over you can go to the toilet.  After your treatment there are some precautions you'll need to take.  This is because BCG is a live vaccine and other people shouldn't be exposed to it.  For the next six  hours, you'll need to avoid your urine splashing on the toilet seat and getting any urine on your hands.  It might be easer for men to sit down when they're using an ordinary toilet although using a stand up urinal should be alright.  The main this is to avoid splashing urine and spreading the vaccine.  You will also be asked to put 1/2 cup undiluted bleach into the toilet to destroy any live vaccine and leave it for 15 minutes until you flush.  Side Effects Because BCG goes directly into the bladder most of the side effects are linked with the bladder.  They usually go away within one to two days after your treatment.  The most common ones are: -needing to pass urine often -pain when you pass urine -blood in urine -flu-like symptoms (tiredness, general aching and raised temperature)  Theses side effects should settle down within a day or two.  If they don't get better contact your doctor.  Drinking lots of fluids can help flush the drug out of your bladder and reduce some of these effects.  Taking Ibuprofen or Aleve is encouraged unless you have a condition that would make these medications unsafe to take (renal failure, diabetes, gerd)  Rare side effects can include a continuing high temperature (fever), pain in your joints and a cough.  If you have any of these symptoms, or if you feel generally unwell, contact your  doctor.  These symptoms could be a sign of a more serious infection (due to BCG) that needs to be treated immediately.  If this happens you'll be treated with the same drugs (antibiotics) that are used to treat TB.  Contraception Men should use a condom during sex for the first 48 hours after their treatment.  If you are a women who has had BCG treatment then your partner should use a condom.  Using a condom will protect your partner from any vaccine present in your semen or vaginal fluid.  We don't know how BCG may affect a developing fetus so it's not advisable to become pregnant or  father a child while having it.  It is important to use effective contraception during your treatment and for six weeks afterwards.  You can discuss this with your doctor or specialist nurse.         Bacillus Calmette-Guerin Live, BCG intravesical solution What is this medicine? BACILLUS CALMETTE-GUERIN LIVE, BCG (ba SIL [place] KAL met gay RAYN) is a bacteria solution. This medicine stimulates the immune system to ward off cancer cells. It is used to treat bladder cancer. This medicine may be used for other purposes; ask your health care provider or pharmacist if you have questions. COMMON BRAND NAME(S): Theracys, TICE BCG What should I tell my health care provider before I take this medicine? They need to know if you have any of these conditions:  aneurysm  blood in the urine  bladder biopsy within 2 weeks  fever or infection  immune system problems  leukemia  lymphoma  myasthenia gravis  need organ transplant  prosthetic device like arterial graft, artificial joint, prosthetic heart valve  recent or ongoing radiation therapy  tuberculosis  an unusual or allergic reaction to Bacillus Calmette-Guerin Live, BCG, latex, other medicines, foods, dyes, or preservatives  pregnant or trying to get pregnant  breast-feeding How should I use this medicine? This drug is given as a catheter infusion into the bladder. It is administered in a hospital or clinic by a specially trained health care provider. You will be given directions to follow before the treatment. Follow your health care provider's directions carefully. This medicine contains live bacteria. It is very important to follow these directions closely after treatment to prevent others from coming in contact with your urine. Your health care provider may give you additional directions to follow. Try to hold this medicine in your bladder for 1 to 2 hours. Follow these directions the first time you go to the bathroom and for 6  hours after the first void.  Wash your hands before using the restroom. After voiding, wash your hands and genital area.  Use a toilet and sit when going to the bathroom. This helps to prevent the urine from splashing. Do not use public toilets or void outside.  After each void, add 2 cups of undiluted bleach to the toilet. Close the lid. Wait 15 to 20 minutes and then flush the toilet.  After the first void, drink more fluids to help dilute your urine.  If you have urinary incontinence, wash the clothes you were wearing in the washer immediately. Do not wash other clothes at the same time.  If you are wearing an incontinence pad, pour bleach on the pad and allow it to soak into the pad before throwing it away. Put the pad in a plastic bag and put it in the trash. Talk to your pediatrician regarding the use of this medicine in children.  Special care may be needed. Overdosage: If you think you have taken too much of this medicine contact a poison control center or emergency room at once. NOTE: This medicine is only for you. Do not share this medicine with others. What if I miss a dose? It is important not to miss your dose. Call your doctor or health care professional if you are unable to keep an appointment. What may interact with this medicine?  antibiotics  medicines to suppress your immune system like chemotherapy agents or corticosteroids  medicine to treat tuberculosis This list may not describe all possible interactions. Give your health care provider a list of all the medicines, herbs, non-prescription drugs, or dietary supplements you use. Also tell them if you smoke, drink alcohol, or use illegal drugs. Some items may interact with your medicine. What should I watch for while using this medicine? Visit your health care provider for checks on your progress. This medicine may make you feel generally unwell. Contact your health care provider if your symptoms last more than 2 days or  if they get worse. Call your health care provider right away if you have a severe or unusual symptom. Infection can be spread to others through contact with this medicine. To prevent the spread of infection, follow your health care provider's directions carefully after treatment. Do not become pregnant while taking this medicine. There is a potential for serious side effects to an unborn child. Talk to your health care provider for more information. Do not breast-feed an infant while taking this medicine. If you have sex while on this medicine, use a condom. Ask your health care provider how long you should use a condom. What side effects may I notice from receiving this medicine? Side effects that you should report to your doctor or health care professional as soon as possible:  allergic reactions like skin rash, itching or hives, swelling of the face, lips, or tongue  signs of infection - fever or chills, cough, sore throat, pain or difficulty passing urine  signs of decreased red blood cells - unusually weak or tired, fainting spells, lightheadedness  blood in urine  breathing problems  cough  eye pain, redness  flu-like symptoms  joint pain  bladder-area pain for more than 2 days after treatment  trouble passing urine or change in the amount of urine  vomiting  yellowing of the eyes or skin Side effects that usually do not require medical attention (report to your doctor or health care professional if they continue or are bothersome):  bladder spasm  burning when passing urine within 2 days of treatment  feel need to pass urine often or wake up at night to pass urine  loss of appetite This list may not describe all possible side effects. Call your doctor for medical advice about side effects. You may report side effects to FDA at 1-800-FDA-1088. Where should I keep my medicine? This drug is given in a hospital or clinic and will not be stored at home. NOTE: This sheet  is a summary. It may not cover all possible information. If you have questions about this medicine, talk to your doctor, pharmacist, or health care provider.  2020 Elsevier/Gold Standard (2018-11-25 12:05:29)

## 2020-08-12 NOTE — Progress Notes (Signed)
Urological Symptom Review  Patient is experiencing the following symptoms: none   Review of Systems  Gastrointestinal (upper)  : Negative for upper GI symptoms  Gastrointestinal (lower) : Negative for lower GI symptoms  Constitutional : Negative for symptoms  Skin: Negative for skin symptoms  Eyes: Negative for eye symptoms  Ear/Nose/Throat : Negative for Ear/Nose/Throat symptoms  Hematologic/Lymphatic: Negative for Hematologic/Lymphatic symptoms  Cardiovascular : Negative for cardiovascular symptoms  Respiratory : Negative for respiratory symptoms  Endocrine: Negative for endocrine symptoms  Musculoskeletal: Negative for musculoskeletal symptoms  Neurological: Negative  Psychologic: Negative for psychiatric symptoms

## 2020-08-12 NOTE — Progress Notes (Signed)
08/12/2020 9:00 AM   Darrell Rivas 05-15-1951 [id number]  Referring provider: Curlene Labrum, MD [place],  [place] [id number]  Followup bladder cancer  HPI: Mr Darrell Rivas is a 69yo here for followup after bladder tumor resection. Pathology showed inflammation. Pathology report reviewed with the patient. Voiding trail passed today. NO hematuria   PMH: Past Medical History:  Diagnosis Date  . Diabetes mellitus without complication ([place])   . Hypercholesteremia   . Hypothyroidism   . Kidney stones   . Sleep apnea   . Thyroid disease     Surgical History: Past Surgical History:  Procedure Laterality Date  . COLONOSCOPY    . CYSTOSCOPY Left 08/05/2020   Procedure: CYSTOSCOPY with REMOVAL OF LEFT URETERAL STENT;  Surgeon: Darrell Gustin, MD;  Location: [facility];  Service: Urology;  Laterality: Left;  . CYSTOSCOPY W/ URETERAL STENT PLACEMENT Bilateral 07/08/2020   Procedure: CYSTOSCOPY WITH BILATERAL RETROGRADE PYELOGRAM/ AND LEFT URETERAL STENT PLACEMENT;  Surgeon: Darrell Gustin, MD;  Location: [facility];  Service: Urology;  Laterality: Bilateral;  . TRANSURETHRAL RESECTION OF BLADDER TUMOR N/A 07/08/2020   Procedure: TRANSURETHRAL RESECTION OF BLADDER TUMOR (TURBT);  Surgeon: Darrell Gustin, MD;  Location: [facility];  Service: Urology;  Laterality: N/A;  . TRANSURETHRAL RESECTION OF BLADDER TUMOR N/A 08/05/2020   Procedure: CYSTOSCOPY WITH TRANSURETHRAL RESECTION OF BLADDER TUMOR (TURBT);  Surgeon: Darrell Gustin, MD;  Location: [facility];  Service: Urology;  Laterality: N/A;    Home Medications:  Allergies as of 08/12/2020   No Known Allergies     Medication List       Accurate as of August 12, 2020  9:00 AM. If you have any questions, ask your nurse or doctor.        atorvastatin 20 MG tablet Commonly known as: LIPITOR Take 20 mg by mouth daily.   Euthyrox 125 MCG tablet Generic drug: levothyroxine Take 125 mcg by mouth daily before breakfast.     latanoprost 0.005 % ophthalmic solution Commonly known as: XALATAN Place 1 drop into both eyes at bedtime.   metFORMIN 500 MG tablet Commonly known as: GLUCOPHAGE Take 500 mg by mouth 2 (two) times daily with a meal.   multivitamin with minerals Tabs tablet Take 1 tablet by mouth daily.   oxyCODONE-acetaminophen 5-325 MG tablet Commonly known as: Percocet Take 1 tablet by mouth every 4 (four) hours as needed for severe pain.       Allergies: No Known Allergies  Family History: Family History  Problem Relation Age of Onset  . Cancer Mother   . Stroke Father   . Cancer Sister   . Diabetes Maternal Grandmother     Social History:  reports that he has never smoked. He has never used smokeless tobacco. He reports that he does not drink alcohol and does not use drugs.  ROS: All other review of systems were reviewed and are negative except what is noted above in HPI  Physical Exam: BP (!) 145/76   Pulse 97   Temp 98.3 F (36.8 C)   Ht 5\' 7"  (1.702 m)   Wt 230 lb (104.3 kg)   BMI 36.02 kg/m   Constitutional:  Alert and oriented, No acute distress. HEENT: [place] AT, moist mucus membranes.  Trachea midline, no masses. Cardiovascular: No clubbing, cyanosis, or edema. Respiratory: Normal respiratory effort, no increased work of breathing. GI: Abdomen is soft, nontender, nondistended, no abdominal masses GU: No CVA tenderness.  Lymph: No cervical  or inguinal lymphadenopathy. Skin: No rashes, bruises or suspicious lesions. Neurologic: Grossly intact, no focal deficits, moving all 4 extremities. Psychiatric: Normal mood and affect.  Laboratory Data: Lab Results  Component Value Date   WBC 6.4 07/05/2020   HGB 13.6 07/05/2020   HCT 41.5 07/05/2020   MCV 93.0 07/05/2020   PLT 185 07/05/2020    Lab Results  Component Value Date   CREATININE 1.00 08/01/2020    No results found for: PSA  No results found for: TESTOSTERONE  Lab Results  Component Value Date    HGBA1C 6.4 (H) 08/01/2020    Urinalysis    Component Value Date/Time   APPEARANCEUR Cloudy (A) 07/30/2020 0956   GLUCOSEU Negative 07/30/2020 0956   BILIRUBINUR Negative 07/30/2020 0956   PROTEINUR 1+ (A) 07/30/2020 0956   NITRITE Positive (A) 07/30/2020 0956   LEUKOCYTESUR 3+ (A) 07/30/2020 0956    Lab Results  Component Value Date   LABMICR See below: 07/30/2020   WBCUA >30 (A) 07/30/2020   LABEPIT 0-10 07/30/2020   BACTERIA Many (A) 07/30/2020    Pertinent Imaging:  No results found for this or any previous visit.  No results found for this or any previous visit.  No results found for this or any previous visit.  No results found for this or any previous visit.  No results found for this or any previous visit.  No results found for this or any previous visit.  Results for orders placed during the hospital encounter of 05/20/20  CT HEMATURIA WORKUP  Narrative CLINICAL DATA:  Gross hematuria. Began over 6 weeks ago with history of uric acid calculi.  EXAM: CT ABDOMEN AND PELVIS WITHOUT AND WITH CONTRAST  TECHNIQUE: Multidetector CT imaging of the abdomen and pelvis was performed following the standard protocol before and following the bolus administration of intravenous contrast.  CONTRAST:  142mL OMNIPAQUE IOHEXOL 300 MG/ML  SOLN  COMPARISON:  June 28, 2019  FINDINGS: Lower chest: Lung bases are clear.  Hepatobiliary: Low-density hepatic parenchyma compatible with steatosis, lobular hepatic contours without focal, suspicious hepatic lesion. Portal vein is patent. Hepatic veins are patent.  Pancreas: Pancreas is normal.  Spleen: Granulomatous changes in the normal size spleen.  Adrenals/Urinary Tract: Adrenal glands are normal.  Nephrolithiasis on the LEFT 3-4 mm calculus in the lower pole LEFT kidney. No ureteral calculi. Urinary bladder is under distended limiting assessment. There is some nodularity however at the LEFT bladder base this  is near the LEFT ureterovesicular orifice best seen on images 80 of series 3 and image 89 of series 11.  Also seen on delayed phase imaging is a small nodular filling defect (image 80, series 4), additionally seen on coronal dataset (image 86, series 15)  Stomach/Bowel: No acute gastrointestinal process. Colonic diverticulosis. Stable appearance of central mesenteric stranding and scattered small lymph nodes in the small bowel mesentery.  Vascular/Lymphatic: Normal caliber abdominal aorta no adenopathy in the retroperitoneum or in the upper abdomen. No pelvic adenopathy.  Reproductive: Prostate with mild heterogeneity. No pelvic adenopathy.  Other: Small fat containing umbilical hernia.  Musculoskeletal: No acute bone finding. No destructive bone process. Spinal degenerative changes.  IMPRESSION: 1. Nodular filling defect at the LEFT bladder base with perceived enhancement, suspicious for small urothelial neoplasm. Cystoscopy is suggested for further evaluation. 2. Nephrolithiasis on the LEFT. 3. Hepatic steatosis. 4. Colonic diverticulosis. 5. Small fat containing umbilical hernia. 6. Aortic atherosclerosis.  These results will be called to the ordering clinician or representative by the Radiologist Assistant,  and communication documented in the PACS or [employer].  Aortic Atherosclerosis (ICD10-I70.0).   Electronically Signed By: Zetta Bills M.D. On: 05/20/2020 16:20  No results found for this or any previous visit.   Assessment & Plan:    1. Malignant neoplasm of lateral wall of urinary bladder (HCC) We will start BCG therapy in 4 weeks RTC 3 months for cystoscopy - PR COMPLEX UROFLOWMETRY   No follow-ups on file.  Nicolette Bang, MD  [facility] [place]

## 2020-08-12 NOTE — Progress Notes (Signed)
Fill and Pull Catheter Removal  Patient is present today for a catheter removal.  Patient was cleaned and prepped in a sterile fashion 29ml of sterile water/ saline was instilled into the bladder when the patient felt the urge to urinate. 81ml of water was then drained from the balloon.  A 22FR foley cath was removed from the bladder no complications were noted .  Patient as then given some time to void on their own.  Patient can void  4ml on their own after some time.  Patient tolerated well.  Performed by: Park Pope RN  Follow up/ Additional notes:   Uroflow  Peak Flow: 48ml Average Flow: 62ml Voided Volume: 27ml Voiding Time: 15sec Flow Time: 15sec Time to Peak Flow: 2sec

## 2020-08-26 ENCOUNTER — Other Ambulatory Visit: Payer: Self-pay

## 2020-08-26 ENCOUNTER — Other Ambulatory Visit: Payer: Medicare Other

## 2020-08-26 DIAGNOSIS — R300 Dysuria: Secondary | ICD-10-CM

## 2020-08-26 LAB — URINALYSIS, ROUTINE W REFLEX MICROSCOPIC
Bilirubin, UA: NEGATIVE
Glucose, UA: NEGATIVE
Nitrite, UA: POSITIVE — AB
Specific Gravity, UA: >1.03 — ABNORMAL HIGH (ref 1.005–1.030)
Urobilinogen, Ur: 0.2 mg/dL (ref 0.2–1.0)
pH, UA: 5 (ref 5.0–7.5)

## 2020-08-26 LAB — MICROSCOPIC EXAMINATION
Epithelial Cells (non renal): NONE SEEN /hpf (ref 0–10)
RBC, Urine: NONE SEEN /hpf (ref 0–2)

## 2020-08-29 LAB — URINE CULTURE

## 2020-09-02 ENCOUNTER — Telehealth: Payer: Self-pay

## 2020-09-02 DIAGNOSIS — R300 Dysuria: Secondary | ICD-10-CM

## 2020-09-02 MED ORDER — NITROFURANTOIN MONOHYD MACRO 100 MG PO CAPS: 100.0000 mg | ORAL_CAPSULE | Freq: Two times a day (BID) | ORAL | 0 refills | Status: DC

## 2020-09-02 NOTE — Telephone Encounter (Signed)
-----   Message from Cleon Gustin, MD sent at 09/02/2020 11:10 AM EST ----- Macrobid 100mg  BID for 7 days ----- Message ----- From: Iris Pert, LPN Sent: 03/79/4446   8:44 AM EST To: Cleon Gustin, MD  Urine drop off

## 2020-09-02 NOTE — Telephone Encounter (Signed)
Pt called and made aware of rx sent in

## 2020-09-03 DIAGNOSIS — E039 Hypothyroidism, unspecified: Secondary | ICD-10-CM | POA: Diagnosis not present

## 2020-09-03 DIAGNOSIS — I10 Essential (primary) hypertension: Secondary | ICD-10-CM | POA: Diagnosis not present

## 2020-09-03 DIAGNOSIS — E7849 Other hyperlipidemia: Secondary | ICD-10-CM | POA: Diagnosis not present

## 2020-09-11 ENCOUNTER — Other Ambulatory Visit: Payer: Self-pay

## 2020-09-11 ENCOUNTER — Ambulatory Visit (INDEPENDENT_AMBULATORY_CARE_PROVIDER_SITE_OTHER): Payer: Medicare Other

## 2020-09-11 DIAGNOSIS — C672 Malignant neoplasm of lateral wall of bladder: Secondary | ICD-10-CM | POA: Diagnosis not present

## 2020-09-11 LAB — URINALYSIS, ROUTINE W REFLEX MICROSCOPIC
Bilirubin, UA: NEGATIVE
Glucose, UA: NEGATIVE
Nitrite, UA: NEGATIVE
Protein,UA: NEGATIVE
Specific Gravity, UA: >1.03 — ABNORMAL HIGH (ref 1.005–1.030)
Urobilinogen, Ur: 0.2 mg/dL (ref 0.2–1.0)
pH, UA: 5 (ref 5.0–7.5)

## 2020-09-11 LAB — MICROSCOPIC EXAMINATION
Bacteria, UA: NONE SEEN
Epithelial Cells (non renal): NONE SEEN /hpf (ref 0–10)
Renal Epithel, UA: NONE SEEN /hpf

## 2020-09-11 MED ORDER — BCG LIVE 50 MG IS SUSR
3.2400 mL | Freq: Once | INTRAVESICAL | Status: AC
  Administered 2020-09-11: 81 mg via INTRAVESICAL

## 2020-09-11 NOTE — Progress Notes (Signed)
BCG Bladder Instillation  BCG # 1 of 6  Due to Bladder Cancer patient is present today for a BCG treatment. Patient was cleaned and prepped in a sterile fashion with betadine. A 14FR catheter was inserted, urine return was noted 4ml, urine was yellow in color.  70ml of reconstituted BCG was instilled into the bladder. The catheter was then removed. Patient tolerated well, no complications were noted  Preformed by: Valentina Lucks, LPN  Follow up/ Additional notes: 1 wk

## 2020-09-11 NOTE — Patient Instructions (Signed)

## 2020-09-11 NOTE — Addendum Note (Signed)
Addended by: Valentina Lucks on: 09/11/2020 03:56 PM   Modules accepted: Orders

## 2020-09-18 ENCOUNTER — Other Ambulatory Visit: Payer: Self-pay

## 2020-09-18 ENCOUNTER — Ambulatory Visit (INDEPENDENT_AMBULATORY_CARE_PROVIDER_SITE_OTHER): Payer: Medicare Other

## 2020-09-18 DIAGNOSIS — C672 Malignant neoplasm of lateral wall of bladder: Secondary | ICD-10-CM | POA: Diagnosis not present

## 2020-09-18 LAB — URINALYSIS, ROUTINE W REFLEX MICROSCOPIC
Bilirubin, UA: NEGATIVE
Glucose, UA: NEGATIVE
Ketones, UA: NEGATIVE
Nitrite, UA: NEGATIVE
Protein,UA: NEGATIVE
Specific Gravity, UA: 1.025 (ref 1.005–1.030)
Urobilinogen, Ur: 0.2 mg/dL (ref 0.2–1.0)
pH, UA: 5 (ref 5.0–7.5)

## 2020-09-18 LAB — MICROSCOPIC EXAMINATION
Epithelial Cells (non renal): NONE SEEN /hpf (ref 0–10)
Renal Epithel, UA: NONE SEEN /hpf

## 2020-09-18 MED ORDER — BCG LIVE 50 MG IS SUSR
3.2400 mL | Freq: Once | INTRAVESICAL | Status: AC
  Administered 2020-09-18: 11:00:00 81 mg via INTRAVESICAL

## 2020-09-18 NOTE — Addendum Note (Signed)
Addended by: Valentina Lucks on: 09/18/2020 11:20 AM   Modules accepted: Orders

## 2020-09-18 NOTE — Addendum Note (Signed)
Addended by: Valentina Lucks on: 09/18/2020 11:16 AM   Modules accepted: Orders

## 2020-09-18 NOTE — Patient Instructions (Signed)

## 2020-09-18 NOTE — Progress Notes (Signed)
BCG Bladder Instillation  BCG # 2 of 6  Due to Bladder Cancer patient is present today for a BCG treatment. Patient was cleaned and prepped in a sterile fashion with betadine. A 14FR catheter was inserted, urine return was noted 82ml, urine was yellow in color.  48ml of reconstituted BCG was instilled into the bladder. The catheter was then removed. Patient tolerated well, no complications were noted  Preformed by: Valentina Lucks,   Follow up/ Additional notes:  1 wk

## 2020-09-23 DIAGNOSIS — E1169 Type 2 diabetes mellitus with other specified complication: Secondary | ICD-10-CM | POA: Diagnosis not present

## 2020-09-25 ENCOUNTER — Ambulatory Visit (INDEPENDENT_AMBULATORY_CARE_PROVIDER_SITE_OTHER): Payer: Medicare Other

## 2020-09-25 ENCOUNTER — Other Ambulatory Visit: Payer: Self-pay

## 2020-09-25 DIAGNOSIS — C672 Malignant neoplasm of lateral wall of bladder: Secondary | ICD-10-CM

## 2020-09-25 DIAGNOSIS — K760 Fatty (change of) liver, not elsewhere classified: Secondary | ICD-10-CM | POA: Diagnosis not present

## 2020-09-25 DIAGNOSIS — D494 Neoplasm of unspecified behavior of bladder: Secondary | ICD-10-CM | POA: Diagnosis not present

## 2020-09-25 DIAGNOSIS — E1169 Type 2 diabetes mellitus with other specified complication: Secondary | ICD-10-CM | POA: Diagnosis not present

## 2020-09-25 MED ORDER — BCG LIVE 50 MG IS SUSR
3.2400 mL | Freq: Once | INTRAVESICAL | Status: AC
  Administered 2020-09-25: 17:00:00 81 mg via INTRAVESICAL

## 2020-09-25 NOTE — Progress Notes (Signed)
BCG Bladder Instillation  BCG # 3 of 6  Due to Bladder Cancer patient is present today for a BCG treatment. Patient was cleaned and prepped in a sterile fashion with betadine. A 14FR catheter was inserted, urine return was noted 36ml, urine was yellow in color.  8ml of reconstituted BCG was instilled into the bladder. The catheter was then removed. Patient tolerated well, no complications were noted  Preformed by: Antionette Char, Shonteria Abeln,LPN  Follow up/ Additional notes: 1 wk

## 2020-09-26 LAB — URINALYSIS, ROUTINE W REFLEX MICROSCOPIC
Bilirubin, UA: NEGATIVE
Glucose, UA: NEGATIVE
Ketones, UA: NEGATIVE
Nitrite, UA: NEGATIVE
Protein,UA: NEGATIVE
Specific Gravity, UA: 1.025 (ref 1.005–1.030)
Urobilinogen, Ur: 0.2 mg/dL (ref 0.2–1.0)
pH, UA: 5 (ref 5.0–7.5)

## 2020-09-26 LAB — MICROSCOPIC EXAMINATION: Renal Epithel, UA: NONE SEEN /hpf

## 2020-10-02 ENCOUNTER — Ambulatory Visit: Payer: Medicare Other

## 2020-10-04 DIAGNOSIS — I10 Essential (primary) hypertension: Secondary | ICD-10-CM | POA: Diagnosis not present

## 2020-10-04 DIAGNOSIS — R7303 Prediabetes: Secondary | ICD-10-CM | POA: Diagnosis not present

## 2020-10-04 DIAGNOSIS — E7849 Other hyperlipidemia: Secondary | ICD-10-CM | POA: Diagnosis not present

## 2020-10-04 DIAGNOSIS — E039 Hypothyroidism, unspecified: Secondary | ICD-10-CM | POA: Diagnosis not present

## 2020-10-09 ENCOUNTER — Ambulatory Visit (INDEPENDENT_AMBULATORY_CARE_PROVIDER_SITE_OTHER): Payer: Medicare Other

## 2020-10-09 ENCOUNTER — Other Ambulatory Visit: Payer: Self-pay

## 2020-10-09 DIAGNOSIS — C672 Malignant neoplasm of lateral wall of bladder: Secondary | ICD-10-CM

## 2020-10-09 LAB — POCT URINALYSIS DIPSTICK
Bilirubin, UA: NEGATIVE
Blood, UA: NEGATIVE
Glucose, UA: NEGATIVE
Ketones, UA: NEGATIVE
Leukocytes, UA: NEGATIVE
Nitrite, UA: NEGATIVE
Protein, UA: NEGATIVE
Urobilinogen, UA: 0.2 E.U./dL
pH, UA: 5 (ref 5.0–8.0)

## 2020-10-09 MED ORDER — BCG LIVE 50 MG IS SUSR
3.2400 mL | Freq: Once | INTRAVESICAL | Status: AC
  Administered 2020-10-09: 81 mg via INTRAVESICAL

## 2020-10-09 NOTE — Progress Notes (Signed)
BCG Bladder Instillation  BCG # 4 of 6  Due to Bladder Cancer patient is present today for a BCG treatment. Patient was cleaned and prepped in a sterile fashion with betadine. A 14FR catheter was inserted, urine return was noted 79ml, urine was yellow in color.  30ml of reconstituted BCG was instilled into the bladder. The catheter was then removed. Patient tolerated well, no complications were noted  Preformed by: Sarita Haver, Kaiden Dardis,LPN  Follow up/ Additional notes: 1 wk

## 2020-10-16 ENCOUNTER — Ambulatory Visit (INDEPENDENT_AMBULATORY_CARE_PROVIDER_SITE_OTHER): Payer: Medicare Other

## 2020-10-16 ENCOUNTER — Other Ambulatory Visit: Payer: Self-pay

## 2020-10-16 DIAGNOSIS — C672 Malignant neoplasm of lateral wall of bladder: Secondary | ICD-10-CM | POA: Diagnosis not present

## 2020-10-16 LAB — URINALYSIS, ROUTINE W REFLEX MICROSCOPIC
Bilirubin, UA: NEGATIVE
Glucose, UA: NEGATIVE
Ketones, UA: NEGATIVE
Nitrite, UA: NEGATIVE
Protein,UA: NEGATIVE
RBC, UA: NEGATIVE
Specific Gravity, UA: 1.02 (ref 1.005–1.030)
Urobilinogen, Ur: 0.2 mg/dL (ref 0.2–1.0)
pH, UA: 5 (ref 5.0–7.5)

## 2020-10-16 LAB — MICROSCOPIC EXAMINATION
Epithelial Cells (non renal): NONE SEEN /hpf (ref 0–10)
RBC, Urine: NONE SEEN /hpf (ref 0–2)
Renal Epithel, UA: NONE SEEN /hpf

## 2020-10-16 MED ORDER — BCG LIVE 50 MG IS SUSR
3.2400 mL | Freq: Once | INTRAVESICAL | Status: AC
  Administered 2020-10-16: 81 mg via INTRAVESICAL

## 2020-10-16 NOTE — Progress Notes (Signed)
BCG Bladder Instillation  BCG # 5 of 6  Due to Bladder Cancer patient is present today for a BCG treatment. Patient was cleaned and prepped in a sterile fashion with betadine. A 14FR catheter was inserted, urine return was noted 51ml, urine was yellow in color.  2ml of reconstituted BCG was instilled into the bladder. The catheter was then removed. Patient tolerated well, no complications were noted  Preformed by: Antionette Char, Chauntay Paszkiewicz,LPN  Follow up/ Additional notes:  1 wk

## 2020-10-23 ENCOUNTER — Other Ambulatory Visit: Payer: Self-pay

## 2020-10-23 ENCOUNTER — Ambulatory Visit: Payer: Medicare Other

## 2020-10-23 ENCOUNTER — Ambulatory Visit (INDEPENDENT_AMBULATORY_CARE_PROVIDER_SITE_OTHER): Payer: Medicare Other

## 2020-10-23 DIAGNOSIS — C672 Malignant neoplasm of lateral wall of bladder: Secondary | ICD-10-CM | POA: Diagnosis not present

## 2020-10-23 LAB — URINALYSIS, ROUTINE W REFLEX MICROSCOPIC
Bilirubin, UA: NEGATIVE
Glucose, UA: NEGATIVE
Ketones, UA: NEGATIVE
Nitrite, UA: NEGATIVE
Protein,UA: NEGATIVE
RBC, UA: NEGATIVE
Specific Gravity, UA: 1.02 (ref 1.005–1.030)
Urobilinogen, Ur: 0.2 mg/dL (ref 0.2–1.0)
pH, UA: 5 (ref 5.0–7.5)

## 2020-10-23 LAB — MICROSCOPIC EXAMINATION
RBC, Urine: NONE SEEN /hpf (ref 0–2)
Renal Epithel, UA: NONE SEEN /hpf

## 2020-10-23 MED ORDER — BCG LIVE 50 MG IS SUSR
3.2400 mL | Freq: Once | INTRAVESICAL | Status: AC
  Administered 2020-10-23: 81 mg via INTRAVESICAL

## 2020-10-23 NOTE — Progress Notes (Addendum)
BCG Bladder Instillation  BCG # 6 of 6  Due to Bladder Cancer patient is present today for a BCG treatment. Patient was cleaned and prepped in a sterile fashion with betadine. A 14FR catheter was inserted, urine return was noted 20ml, urine was yellow in color.  50ml of reconstituted BCG was instilled into the bladder. The catheter was then removed. Patient tolerated well, no complications were noted  Preformed by: Brylyn Novakovich,LPN     

## 2020-11-02 DIAGNOSIS — I10 Essential (primary) hypertension: Secondary | ICD-10-CM | POA: Diagnosis not present

## 2020-11-02 DIAGNOSIS — R7303 Prediabetes: Secondary | ICD-10-CM | POA: Diagnosis not present

## 2020-11-02 DIAGNOSIS — E039 Hypothyroidism, unspecified: Secondary | ICD-10-CM | POA: Diagnosis not present

## 2020-11-02 DIAGNOSIS — E7849 Other hyperlipidemia: Secondary | ICD-10-CM | POA: Diagnosis not present

## 2020-12-02 DIAGNOSIS — E7849 Other hyperlipidemia: Secondary | ICD-10-CM | POA: Diagnosis not present

## 2020-12-02 DIAGNOSIS — I10 Essential (primary) hypertension: Secondary | ICD-10-CM | POA: Diagnosis not present

## 2020-12-02 DIAGNOSIS — E039 Hypothyroidism, unspecified: Secondary | ICD-10-CM | POA: Diagnosis not present

## 2020-12-02 DIAGNOSIS — R7303 Prediabetes: Secondary | ICD-10-CM | POA: Diagnosis not present

## 2020-12-06 ENCOUNTER — Other Ambulatory Visit: Payer: Self-pay

## 2020-12-06 ENCOUNTER — Telehealth: Payer: Self-pay

## 2020-12-06 ENCOUNTER — Other Ambulatory Visit: Payer: Medicare Other

## 2020-12-06 DIAGNOSIS — R300 Dysuria: Secondary | ICD-10-CM

## 2020-12-06 LAB — URINALYSIS, ROUTINE W REFLEX MICROSCOPIC
Bilirubin, UA: NEGATIVE
Glucose, UA: NEGATIVE
Ketones, UA: NEGATIVE
Nitrite, UA: NEGATIVE
Protein,UA: NEGATIVE
Specific Gravity, UA: 1.02 (ref 1.005–1.030)
Urobilinogen, Ur: 0.2 mg/dL (ref 0.2–1.0)
pH, UA: 7 (ref 5.0–7.5)

## 2020-12-06 LAB — MICROSCOPIC EXAMINATION
Epithelial Cells (non renal): NONE SEEN /hpf (ref 0–10)
Renal Epithel, UA: NONE SEEN /hpf

## 2020-12-06 MED ORDER — NITROFURANTOIN MONOHYD MACRO 100 MG PO CAPS: 100.0000 mg | ORAL_CAPSULE | Freq: Two times a day (BID) | ORAL | 0 refills | Status: DC

## 2020-12-06 NOTE — Progress Notes (Signed)
Patient came for urine drop off. UA reviewed with Dr. Alyson Ingles. Medication sent to pharmacy. Patient notified.

## 2020-12-06 NOTE — Telephone Encounter (Signed)
Spoke with pt. Coming today to leave ua/urine culture sample.

## 2020-12-06 NOTE — Telephone Encounter (Signed)
Patient has UTI symptoms of frequent urination and some burning for about a week.  Not getting better. Wants an appointment.  Call pt back at [phone].   Thanks, Helene Kelp

## 2020-12-06 NOTE — Telephone Encounter (Signed)
FYI

## 2020-12-09 LAB — URINE CULTURE

## 2020-12-12 ENCOUNTER — Telehealth: Payer: Self-pay

## 2020-12-12 NOTE — Telephone Encounter (Signed)
-----   Message from Cleon Gustin, MD sent at 12/12/2020  8:41 AM EST ----- Have him continue to take macrobid ----- Message ----- From: Iris Pert, LPN Sent: 12/06/74  22:63 AM EST To: Cleon Gustin, MD  Patient started on [facility] 12/06/20 Please review

## 2020-12-12 NOTE — Telephone Encounter (Signed)
Patient called and made aware.

## 2020-12-16 ENCOUNTER — Other Ambulatory Visit: Payer: Self-pay

## 2020-12-16 ENCOUNTER — Encounter: Payer: Self-pay | Admitting: Urology

## 2020-12-16 ENCOUNTER — Ambulatory Visit (INDEPENDENT_AMBULATORY_CARE_PROVIDER_SITE_OTHER): Payer: Medicare Other | Admitting: Urology

## 2020-12-16 VITALS — BP 149/78 | HR 78 | Temp 98.5°F | Ht 67.0 in | Wt 235.0 lb

## 2020-12-16 DIAGNOSIS — C672 Malignant neoplasm of lateral wall of bladder: Secondary | ICD-10-CM

## 2020-12-16 LAB — URINALYSIS, ROUTINE W REFLEX MICROSCOPIC
Bilirubin, UA: NEGATIVE
Glucose, UA: NEGATIVE
Ketones, UA: NEGATIVE
Nitrite, UA: NEGATIVE
Protein,UA: NEGATIVE
RBC, UA: NEGATIVE
Specific Gravity, UA: 1.02 (ref 1.005–1.030)
Urobilinogen, Ur: 0.2 mg/dL (ref 0.2–1.0)
pH, UA: 6 (ref 5.0–7.5)

## 2020-12-16 LAB — MICROSCOPIC EXAMINATION
RBC, Urine: NONE SEEN /hpf (ref 0–2)
Renal Epithel, UA: NONE SEEN /hpf

## 2020-12-16 MED ORDER — CIPROFLOXACIN HCL 500 MG PO TABS
500.0000 mg | ORAL_TABLET | Freq: Once | ORAL | Status: AC
  Administered 2020-12-16: 500 mg via ORAL

## 2020-12-16 NOTE — Progress Notes (Signed)
   12/16/20  CC: Followup bladder cancer  HPI: Mr Furey is a 70yo here for followup for T1G3 bladder cancer. He complete 6 week course BCG  10/23/2020. No LUTS. No hematuria Blood pressure (!) 149/78, pulse 78, temperature 98.5 F (36.9 C), height 5\' 7"  (1.702 m), weight 235 lb (106.6 kg). NED. A&Ox3.   No respiratory distress   Abd soft, NT, ND Normal phallus with bilateral descended testicles  Cystoscopy Procedure Note  Patient identification was confirmed, informed consent was obtained, and patient was prepped using Betadine solution.  Lidocaine jelly was administered per urethral meatus.     Pre-Procedure: - Inspection reveals a normal caliber ureteral meatus.  Procedure: The flexible cystoscope was introduced without difficulty - No urethral strictures/lesions are present. - Enlarged prostate  - Normal bladder neck - Bilateral ureteral orifices identified - Bladder mucosa  reveals no ulcers, tumors, or lesions - No bladder stones - No trabeculation  Retroflexion shows no intravesical prostatic protrusion   Post-Procedure: - Patient tolerated the procedure well  Assessment/ Plan: RTC 3 months for cystoscopy  Nicolette Bang, MD

## 2020-12-16 NOTE — Patient Instructions (Signed)
Bladder Cancer  Bladder cancer is a condition in which abnormal tissue (a tumor) grows in the bladder. The bladder is the organ that holds urine. Two tubes (ureters) carry the urine from the kidneys to the bladder. The bladder wall is made of layers of tissue. Cancer that spreads through these layers of the bladder wall becomes more difficult to treat. What are the causes? The cause of this condition is not known. What increases the risk? The following factors may make you more likely to develop this condition:  Smoking.  Working where there are risks (occupational exposures), such as working with rubber, leather, clothing fabric, dyes, chemicals, and paint.  Being 55 years of age or older.  Being male.  Having bladder inflammation that is long-term (chronic).  Having a history of cancer, including: ? A family history of bladder cancer. ? Personal experience with bladder cancer. ? Having had certain treatments for cancer before. These include:  Medicines to kill cancer cells (chemotherapy).  Strong X-ray beams or capsules high in energy to kill cancer cells and shrink tumors (radiation therapy).  Having been exposed to arsenic. This is a chemical element that can poison you. What are the signs or symptoms? Early symptoms of this condition include:  Seeing blood in your urine.  Feeling pain when urinating.  Having infections of your urinary system (urinary tract infections or UTIs) that happen often.  Having to urinate sooner or more often than usual. Later symptoms of this condition include:  Not being able to urinate.  Pain on one side of your lower back.  Loss of appetite.  Weight loss.  Tiredness (fatigue).  Swelling in your feet.  Bone pain. How is this diagnosed? This condition is diagnosed based on:  Your medical history.  A physical exam.  Lab tests, such as urine tests.  Imaging tests.  Your symptoms. You may also have other tests or  procedures done, such as:  A cystoscopy. A narrow tube is inserted into your bladder through the organ that connects your bladder to the outside of your body (urethra). This is done to view the lining of your bladder for tumors.  A biopsy. This procedure involves removing a tissue sample to look at it under a microscope to see if cancer is present. It is important to find out:  How deeply into the bladder wall cancer has grown.  Whether cancer has spread to any other parts of your body. This may require blood tests or imaging tests, such as a CT scan, MRI, bone scan, or X-rays. How is this treated? Your health care provider may recommend one or more types of treatment based on the stage of your cancer. The most common types of treatment are:  Surgery to remove the cancer. Procedures that may be done include: ? Removing a tumor on the inside wall of the bladder (transurethral resection). ? Removing the bladder (cystectomy).  Radiation therapy. This is often used together with chemotherapy.  Chemotherapy.  Immunotherapy. This uses medicines to help your immune system destroy cancer cells. Follow these instructions at home:  Take over-the-counter and prescription medicines only as told by your health care provider.  Eat a healthy diet. Some of your treatments might affect your appetite.  Do not use any products that contain nicotine or tobacco, such as cigarettes, e-cigarettes, and chewing tobacco. If you need help quitting, ask your health care provider.  Consider joining a support group. This may help you learn to cope with the stress of having   bladder cancer.  Tell your cancer care team if you develop side effects. Your team may be able to recommend ways to get relief.  Keep all follow-up visits as told by your health care provider. This is important. Where to find more information  American Cancer Society: www.cancer.org  National Cancer Institute (NCI):  www.cancer.gov Contact a health care provider if:  You have symptoms of a urinary tract infection. These include: ? Fever. ? Chills. ? Weakness. ? Muscle aches. ? Pain in your abdomen. ? Urge to urinate that is stronger and happens more often than usual. ? Burning feeling in the bladder or urethra when you urinate. Get help right away if:  There is blood in your urine.  You cannot urinate.  You have severe pain or other symptoms that do not go away. Summary  Bladder cancer is a condition in which tumors grow in the bladder and cause illness.  This condition is diagnosed based on your medical history, a physical exam, lab tests, imaging tests, and your symptoms.  Your health care provider may recommend one or more types of treatment based on the stage of your cancer.  Consider joining a support group. This may help you learn to cope with the stress of having bladder cancer. This information is not intended to replace advice given to you by your health care provider. Make sure you discuss any questions you have with your health care provider. Document Revised: 05/31/2019 Document Reviewed: 05/31/2019 Elsevier Patient Education  2021 Elsevier Inc.  

## 2020-12-16 NOTE — Progress Notes (Signed)
Urological Symptom Review  Patient is experiencing the following symptoms: none   Review of Systems  Gastrointestinal (upper)  : Negative for upper GI symptoms  Gastrointestinal (lower) : Negative for lower GI symptoms  Constitutional : Negative for symptoms  Skin: Negative for skin symptoms  Eyes: Negative for eye symptoms  Ear/Nose/Throat : Negative for Ear/Nose/Throat symptoms  Hematologic/Lymphatic: Negative for Hematologic/Lymphatic symptoms  Cardiovascular : Negative for cardiovascular symptoms  Respiratory : Cough  Endocrine: Negative for endocrine symptoms  Musculoskeletal: Negative for musculoskeletal symptoms  Neurological: Negative for neurological symptoms  Psychologic: Negative for psychiatric symptoms

## 2021-01-01 DIAGNOSIS — R7303 Prediabetes: Secondary | ICD-10-CM | POA: Diagnosis not present

## 2021-01-01 DIAGNOSIS — E039 Hypothyroidism, unspecified: Secondary | ICD-10-CM | POA: Diagnosis not present

## 2021-01-01 DIAGNOSIS — E7849 Other hyperlipidemia: Secondary | ICD-10-CM | POA: Diagnosis not present

## 2021-01-01 DIAGNOSIS — I10 Essential (primary) hypertension: Secondary | ICD-10-CM | POA: Diagnosis not present

## 2021-01-22 ENCOUNTER — Other Ambulatory Visit: Payer: Self-pay

## 2021-01-22 ENCOUNTER — Telehealth: Payer: Self-pay

## 2021-01-22 ENCOUNTER — Other Ambulatory Visit: Payer: Medicare Other

## 2021-01-22 DIAGNOSIS — N390 Urinary tract infection, site not specified: Secondary | ICD-10-CM

## 2021-01-22 LAB — URINALYSIS, ROUTINE W REFLEX MICROSCOPIC
Bilirubin, UA: NEGATIVE
Glucose, UA: NEGATIVE
Ketones, UA: NEGATIVE
Nitrite, UA: NEGATIVE
Protein,UA: NEGATIVE
Specific Gravity, UA: <1.005 — ABNORMAL LOW (ref 1.005–1.030)
Urobilinogen, Ur: 0.2 mg/dL (ref 0.2–1.0)
pH, UA: 6 (ref 5.0–7.5)

## 2021-01-22 LAB — MICROSCOPIC EXAMINATION: RBC, Urine: NONE SEEN /hpf (ref 0–2)

## 2021-01-22 NOTE — Telephone Encounter (Signed)
Pt called saying he was having frequency, burning and itching with urination. Pt to drop off specimen today.

## 2021-01-24 ENCOUNTER — Other Ambulatory Visit: Payer: Self-pay

## 2021-01-24 DIAGNOSIS — R300 Dysuria: Secondary | ICD-10-CM

## 2021-01-24 MED ORDER — NITROFURANTOIN MONOHYD MACRO 100 MG PO CAPS: 100.0000 mg | ORAL_CAPSULE | Freq: Two times a day (BID) | ORAL | 0 refills | Status: DC

## 2021-01-24 NOTE — Progress Notes (Signed)
Called pt and notified him antibiotic sent into pharmacy per Dr. Alyson Ingles.

## 2021-01-25 LAB — CULTURE, URINE COMPREHENSIVE

## 2021-01-31 ENCOUNTER — Telehealth: Payer: Self-pay

## 2021-01-31 NOTE — Telephone Encounter (Signed)
Patient called and notified to complete antibiotic course.  Patient voiced understanding.

## 2021-01-31 NOTE — Telephone Encounter (Signed)
-----   Message from Cleon Gustin, MD sent at 01/31/2021 10:50 AM EDT ----- Continue macrobid ----- Message ----- From: Dorisann Frames, RN Sent: 01/27/2021   9:29 AM EDT To: Cleon Gustin, MD  Patient given Nitrofurantoin on 04/22

## 2021-02-13 DIAGNOSIS — S39012A Strain of muscle, fascia and tendon of lower back, initial encounter: Secondary | ICD-10-CM | POA: Diagnosis not present

## 2021-02-13 DIAGNOSIS — M545 Low back pain, unspecified: Secondary | ICD-10-CM | POA: Diagnosis not present

## 2021-03-19 ENCOUNTER — Encounter: Payer: Self-pay | Admitting: Urology

## 2021-03-19 ENCOUNTER — Other Ambulatory Visit: Payer: Self-pay

## 2021-03-19 ENCOUNTER — Ambulatory Visit (INDEPENDENT_AMBULATORY_CARE_PROVIDER_SITE_OTHER): Payer: Medicare Other | Admitting: Urology

## 2021-03-19 VITALS — BP 126/62 | HR 66

## 2021-03-19 DIAGNOSIS — C672 Malignant neoplasm of lateral wall of bladder: Secondary | ICD-10-CM

## 2021-03-19 MED ORDER — CIPROFLOXACIN HCL 500 MG PO TABS
500.0000 mg | ORAL_TABLET | Freq: Once | ORAL | Status: AC
  Administered 2021-03-19: 11:00:00 500 mg via ORAL

## 2021-03-19 NOTE — Progress Notes (Signed)
   03/19/21  CC: followup bladder cancer  HPI: Darrell Rivas is a 70yo with a hx of high grade bladder cancer diagnosed in Oct 2021.  Blood pressure 126/62, pulse 66. NED. A&Ox3.   No respiratory distress   Abd soft, NT, ND Normal phallus with bilateral descended testicles  Cystoscopy Procedure Note  Patient identification was confirmed, informed consent was obtained, and patient was prepped using Betadine solution.  Lidocaine jelly was administered per urethral meatus.     Pre-Procedure: - Inspection reveals a normal caliber ureteral meatus.  Procedure: The flexible cystoscope was introduced without difficulty - No urethral strictures/lesions are present. - Enlarged prostate  - Normal bladder neck - Bilateral ureteral orifices identified - Bladder mucosa  reveals no ulcers, tumors, or lesions - No bladder stones - No trabeculation  Retroflexion shows no intravesical prostatic protrusion   Post-Procedure: - Patient tolerated the procedure well  Assessment/ Plan: RTC 3 months for cystoscopy  Nicolette Bang, MD

## 2021-03-19 NOTE — Progress Notes (Signed)

## 2021-03-19 NOTE — Patient Instructions (Signed)
Bladder Cancer Bladder cancer is a condition in which abnormal tissue (a tumor) grows in the bladder. The bladder is the organ that holds urine. Two tubes (ureters) carry the urine from the kidneys to the bladder. The bladder wall is made of layers of tissue. Cancer that spreads through these layers of the bladder wall becomes more difficult to treat. What are the causes? The cause of this condition is not known. What increases the risk? The following factors may make you more likely to develop this condition: Smoking. Working where there are risks (occupational exposures), such as working with rubber, leather, clothing fabric, dyes, chemicals, and paint. Being 72 years of age or older. Being male. Having bladder inflammation that is long-term (chronic). Having a history of cancer, including: A family history of bladder cancer. Personal experience with bladder cancer. Having had certain treatments for cancer before. These include: Medicines to kill cancer cells (chemotherapy). Strong X-ray beams or capsules high in energy to kill cancer cells and shrink tumors (radiation therapy). Having been exposed to arsenic. This is a chemical element that can poison you. What are the signs or symptoms? Early symptoms of this condition include: Seeing blood in your urine. Feeling pain when urinating. Having infections of your urinary system (urinary tract infections or UTIs) that happen often. Having to urinate sooner or more often than usual. Later symptoms of this condition include: Not being able to urinate. Pain on one side of your lower back. Loss of appetite. Weight loss. Tiredness (fatigue). Swelling in your feet. Bone pain. How is this diagnosed? This condition is diagnosed based on: Your medical history. A physical exam. Lab tests, such as urine tests. Imaging tests. Your symptoms. You may also have other tests or procedures done, such as: A cystoscopy. A narrow tube is inserted  into your bladder through the organ that connects your bladder to the outside of your body (urethra). This is done to view the lining of your bladder for tumors. A biopsy. This procedure involves removing a tissue sample to look at it under a microscope to see if cancer is present. It is important to find out: How deeply into the bladder wall cancer has grown. Whether cancer has spread to any other parts of your body. This may require blood tests or imaging tests, such as a CT scan, MRI, bone scan, or X-rays. How is this treated? Your health care provider may recommend one or more types of treatment based on the stage of your cancer. The most common types of treatment are: Surgery to remove the cancer. Procedures that may be done include: Removing a tumor on the inside wall of the bladder (transurethral resection). Removing the bladder (cystectomy). Radiation therapy. This is often used together with chemotherapy. Chemotherapy. Immunotherapy. This uses medicines to help your immune system destroy cancer cells. Follow these instructions at home: Take over-the-counter and prescription medicines only as told by your health care provider. Eat a healthy diet. Some of your treatments might affect your appetite. Do not use any products that contain nicotine or tobacco, such as cigarettes, e-cigarettes, and chewing tobacco. If you need help quitting, ask your health care provider. Consider joining a support group. This may help you learn to cope with the stress of having bladder cancer. Tell your cancer care team if you develop side effects. Your team may be able to recommend ways to get relief. Keep all follow-up visits as told by your health care provider. This is important. Where to find more information American   Cancer Society: www.cancer.org National Cancer Institute (NCI): www.cancer.gov Contact a health care provider if: You have symptoms of a urinary tract infection. These  include: Fever. Chills. Weakness. Muscle aches. Pain in your abdomen. Urge to urinate that is stronger and happens more often than usual. Burning feeling in the bladder or urethra when you urinate. Get help right away if: There is blood in your urine. You cannot urinate. You have severe pain or other symptoms that do not go away. Summary Bladder cancer is a condition in which tumors grow in the bladder and cause illness. This condition is diagnosed based on your medical history, a physical exam, lab tests, imaging tests, and your symptoms. Your health care provider may recommend one or more types of treatment based on the stage of your cancer. Consider joining a support group. This may help you learn to cope with the stress of having bladder cancer. This information is not intended to replace advice given to you by your health care provider. Make sure you discuss any questions you have with your health care provider. Document Revised: 05/31/2019 Document Reviewed: 05/31/2019 Elsevier Patient Education  2022 Elsevier Inc.  

## 2021-03-20 DIAGNOSIS — R5382 Chronic fatigue, unspecified: Secondary | ICD-10-CM | POA: Diagnosis not present

## 2021-03-20 DIAGNOSIS — E1169 Type 2 diabetes mellitus with other specified complication: Secondary | ICD-10-CM | POA: Diagnosis not present

## 2021-03-20 DIAGNOSIS — Z1159 Encounter for screening for other viral diseases: Secondary | ICD-10-CM | POA: Diagnosis not present

## 2021-03-20 DIAGNOSIS — E7849 Other hyperlipidemia: Secondary | ICD-10-CM | POA: Diagnosis not present

## 2021-03-20 DIAGNOSIS — E782 Mixed hyperlipidemia: Secondary | ICD-10-CM | POA: Diagnosis not present

## 2021-03-20 LAB — URINALYSIS, ROUTINE W REFLEX MICROSCOPIC
Bilirubin, UA: NEGATIVE
Glucose, UA: NEGATIVE
Ketones, UA: NEGATIVE
Nitrite, UA: NEGATIVE
Protein,UA: NEGATIVE
Specific Gravity, UA: 1.02 (ref 1.005–1.030)
Urobilinogen, Ur: 0.2 mg/dL (ref 0.2–1.0)
pH, UA: 6 (ref 5.0–7.5)

## 2021-03-20 LAB — MICROSCOPIC EXAMINATION

## 2021-04-02 DIAGNOSIS — G4733 Obstructive sleep apnea (adult) (pediatric): Secondary | ICD-10-CM | POA: Diagnosis not present

## 2021-04-02 DIAGNOSIS — E039 Hypothyroidism, unspecified: Secondary | ICD-10-CM | POA: Diagnosis not present

## 2021-04-02 DIAGNOSIS — C679 Malignant neoplasm of bladder, unspecified: Secondary | ICD-10-CM | POA: Diagnosis not present

## 2021-04-02 DIAGNOSIS — K760 Fatty (change of) liver, not elsewhere classified: Secondary | ICD-10-CM | POA: Diagnosis not present

## 2021-04-02 DIAGNOSIS — I700 Atherosclerosis of aorta: Secondary | ICD-10-CM | POA: Diagnosis not present

## 2021-04-02 DIAGNOSIS — E1169 Type 2 diabetes mellitus with other specified complication: Secondary | ICD-10-CM | POA: Diagnosis not present

## 2021-04-02 DIAGNOSIS — E7849 Other hyperlipidemia: Secondary | ICD-10-CM | POA: Diagnosis not present

## 2021-04-03 DIAGNOSIS — E7849 Other hyperlipidemia: Secondary | ICD-10-CM | POA: Diagnosis not present

## 2021-04-03 DIAGNOSIS — I10 Essential (primary) hypertension: Secondary | ICD-10-CM | POA: Diagnosis not present

## 2021-04-03 DIAGNOSIS — R7303 Prediabetes: Secondary | ICD-10-CM | POA: Diagnosis not present

## 2021-04-03 DIAGNOSIS — E039 Hypothyroidism, unspecified: Secondary | ICD-10-CM | POA: Diagnosis not present

## 2021-04-11 IMAGING — CT CT ABD-PEL WO/W CM
4 of 12 series · 12 of 46 positions shown, 18 images · IV contrast (Omnipaque or Isovue)
Comparison: June 28, 2019

CLINICAL DATA: Gross hematuria. Began over 6 weeks ago with history
of uric acid calculi.

EXAM:
CT ABDOMEN AND PELVIS WITHOUT AND WITH CONTRAST
TECHNIQUE: Multidetector CT imaging of the abdomen and pelvis was performed
following the standard protocol before and following the bolus
administration of intravenous contrast.
CONTRAST:  125mL OMNIPAQUE IOHEXOL 300 MG/ML  SOLN

[Series 2: axial pre · axial · non-contrast · 0.84mm/px · z∈[+1003,+1288]mm · 4 of 97 slices shown, 9 images]
[im 20/97  soft-tissue]
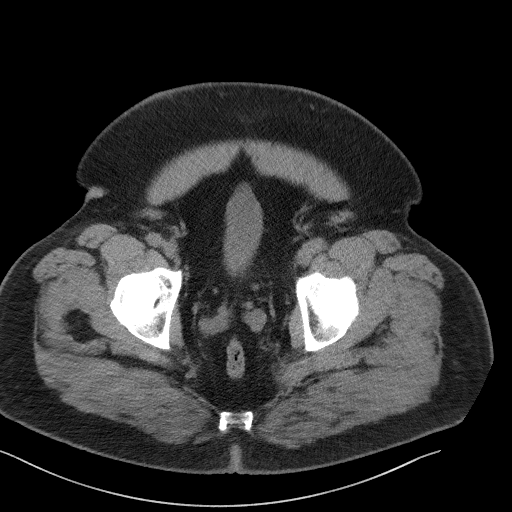
[im 20/97  lung]
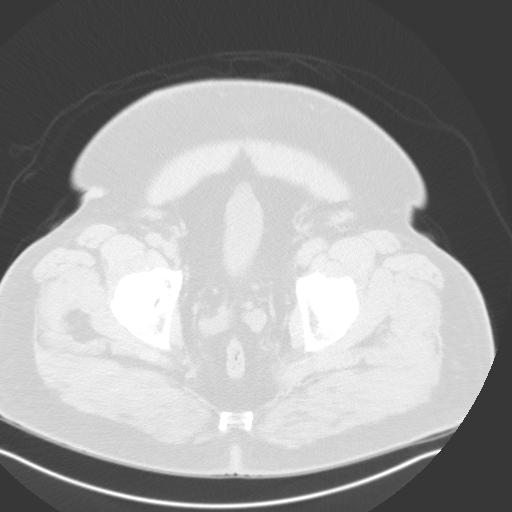
[im 20/97  bone]
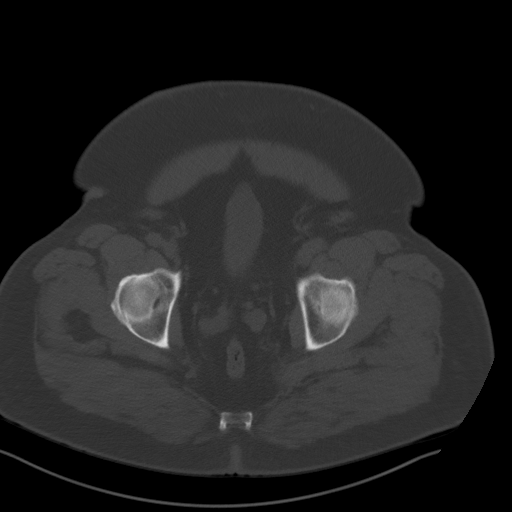
[im 39/97  soft-tissue]
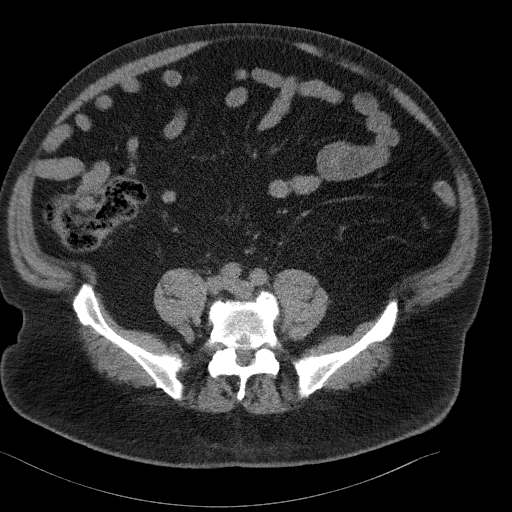
[im 39/97  lung]
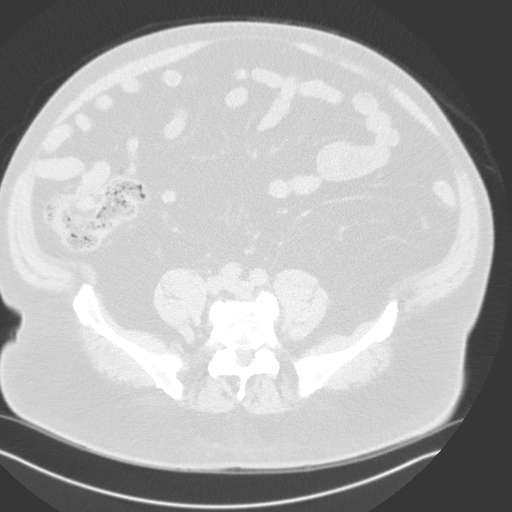
[im 58/97  soft-tissue]
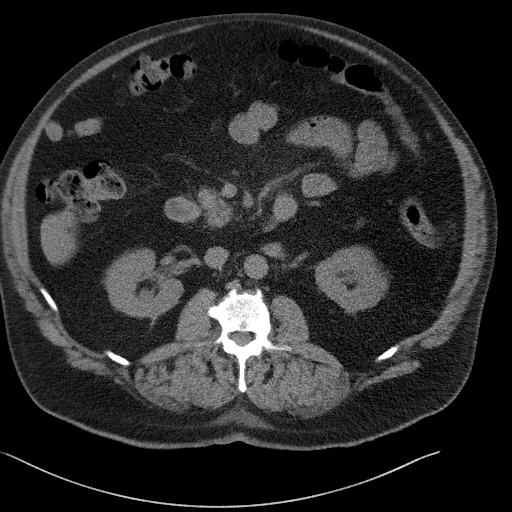
[im 58/97  lung]
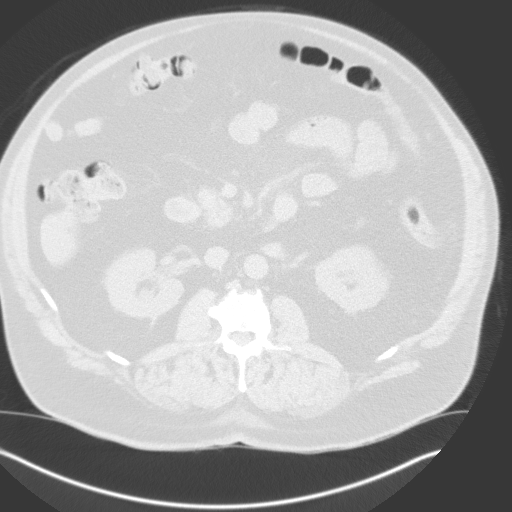
[im 77/97  soft-tissue]
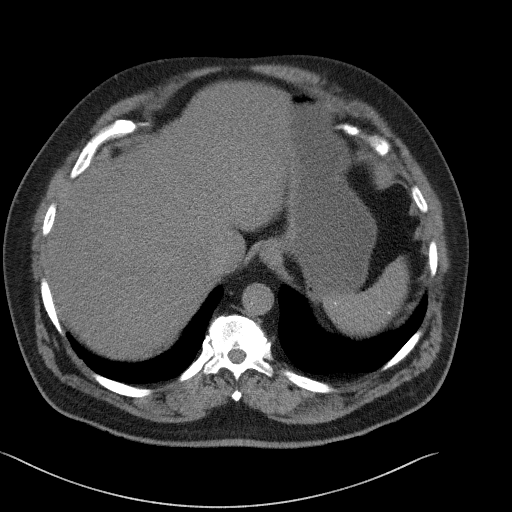
[im 77/97  lung]
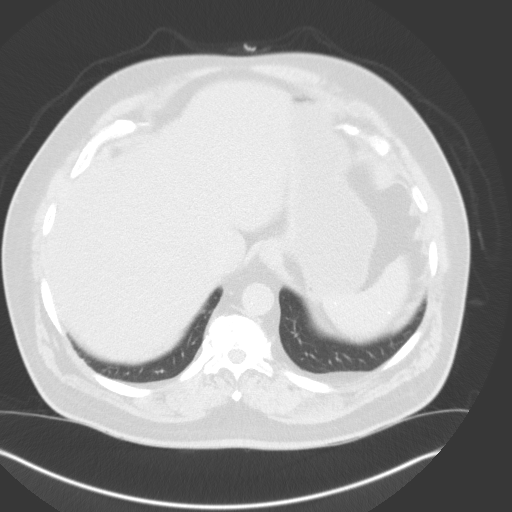

[Series 3: axial post · axial · 0.84mm/px · z∈[+1013,+1263]mm · 3 of 100 slices shown]
[im 25/100  soft-tissue]
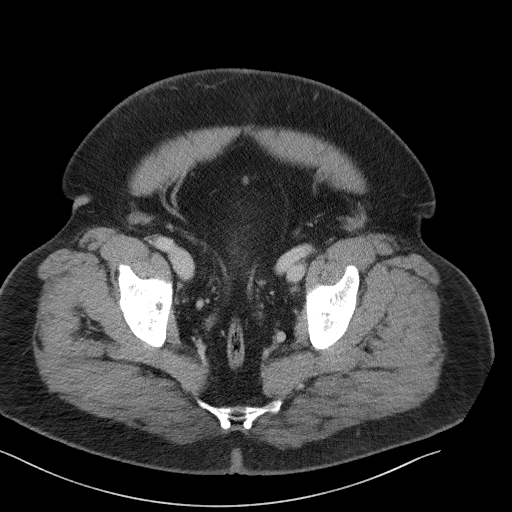
[im 50/100  soft-tissue]
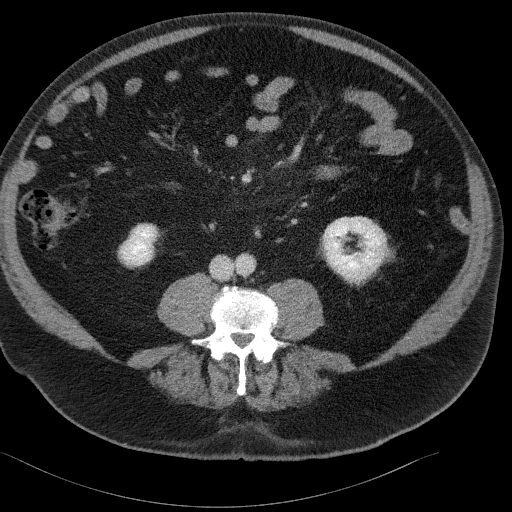
[im 75/100  soft-tissue]
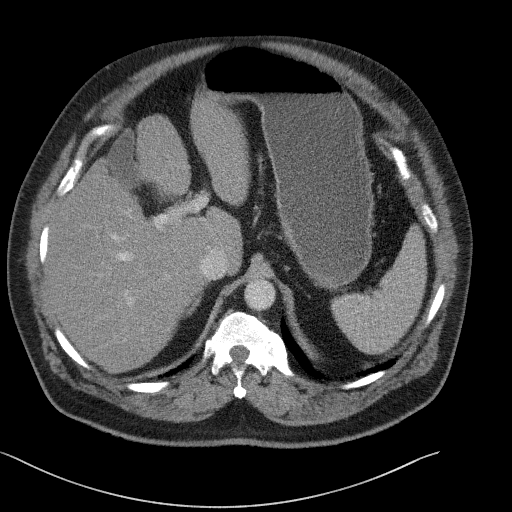

[Series 4: axial delay · axial · delayed · 0.84mm/px · z∈[+1026,+1266]mm · 3 of 98 slices shown]
[im 25/98  soft-tissue]
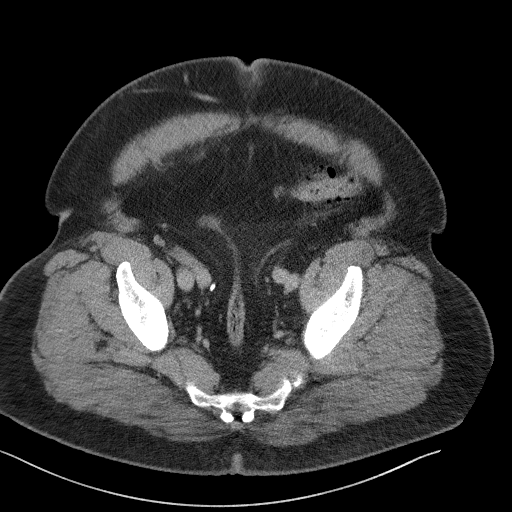
[im 49/98  soft-tissue]
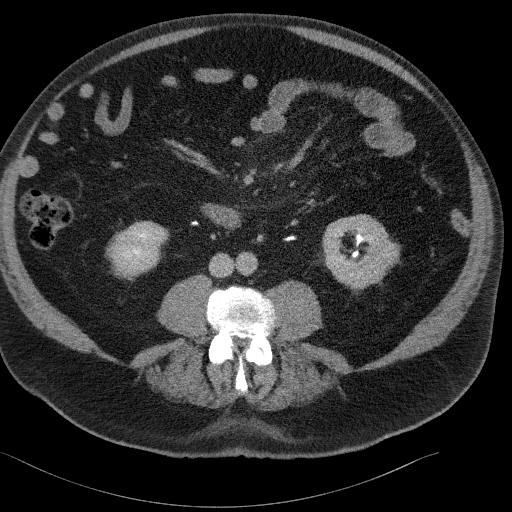
[im 73/98  soft-tissue]
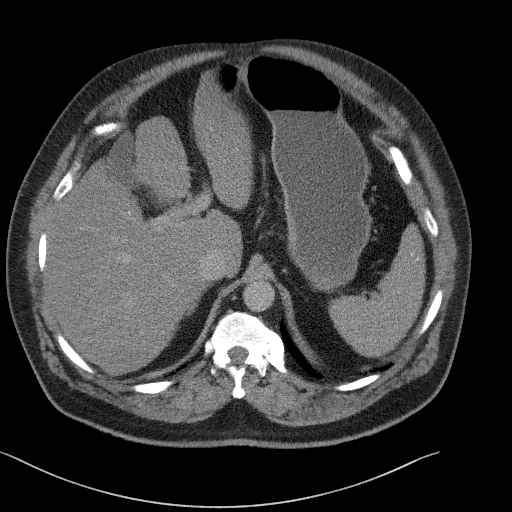

[Series 9: coronal pre · coronal · non-contrast · 0.94mm/px · 2 of 131 slices shown, 3 images]
[im 44/131  soft-tissue]
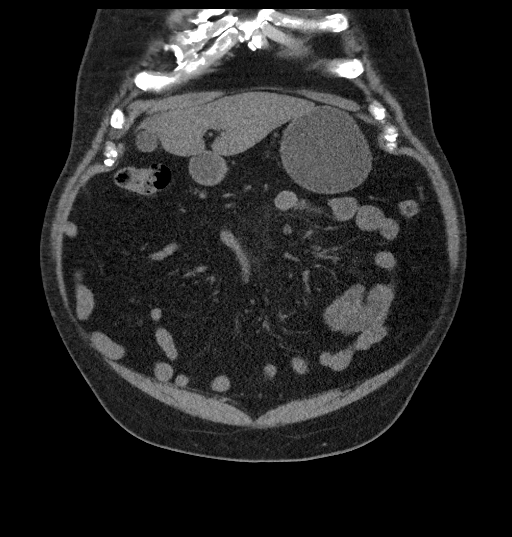
[im 44/131  bone]
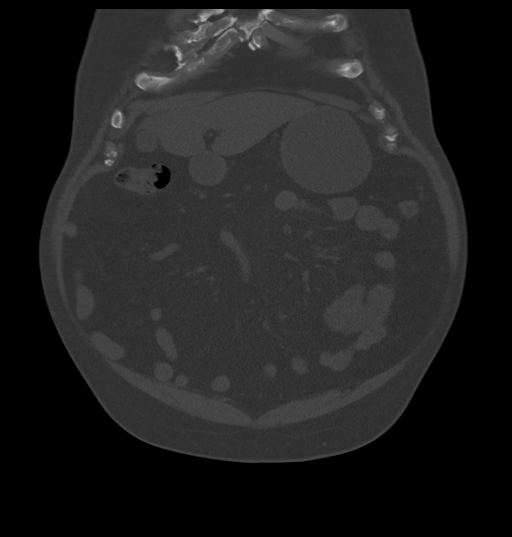
[im 87/131  soft-tissue]
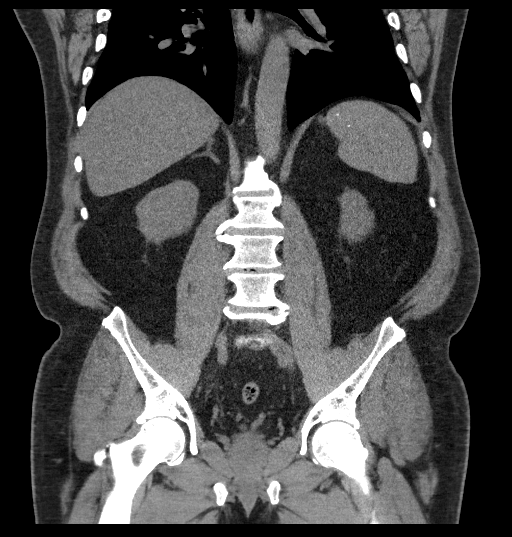

[12 of 46 positions shown; findings below may reference images not displayed]

FINDINGS: Lower chest: Lung bases are clear.

Hepatobiliary: Low-density hepatic parenchyma compatible with
steatosis, lobular hepatic contours without focal, suspicious
hepatic lesion. Portal vein is patent. Hepatic veins are patent.

Pancreas: Pancreas is normal.

Spleen: Granulomatous changes in the normal size spleen.

Adrenals/Urinary Tract: Adrenal glands are normal.

Nephrolithiasis on the LEFT 3-4 mm calculus in the lower pole LEFT
kidney. No ureteral calculi. Urinary bladder is under distended
limiting assessment. There is some nodularity however at the LEFT
bladder base this is near the LEFT ureterovesicular orifice best
seen on images 80 of series 3 and image 89 of series 11.

Also seen on delayed phase imaging is a small nodular filling defect
(image 80, series 4), additionally seen on coronal dataset (image
86, series 15)

Stomach/Bowel: No acute gastrointestinal process. Colonic
diverticulosis. Stable appearance of central mesenteric stranding
and scattered small lymph nodes in the small bowel mesentery.

Vascular/Lymphatic: Normal caliber abdominal aorta no adenopathy in
the retroperitoneum or in the upper abdomen. No pelvic adenopathy.

Reproductive: Prostate with mild heterogeneity. No pelvic
adenopathy.

Other: Small fat containing umbilical hernia.

Musculoskeletal: No acute bone finding. No destructive bone process.
Spinal degenerative changes.
IMPRESSION: 1. Nodular filling defect at the LEFT bladder base with perceived
enhancement, suspicious for small urothelial neoplasm. Cystoscopy is
suggested for further evaluation.
2. Nephrolithiasis on the LEFT.
3. Hepatic steatosis.
4. Colonic diverticulosis.
5. Small fat containing umbilical hernia.
6. Aortic atherosclerosis.

These results will be called to the ordering clinician or
representative by the Radiologist Assistant, and communication
documented in the PACS or [REDACTED].

Aortic Atherosclerosis ([id number]).

## 2021-05-04 DIAGNOSIS — E7849 Other hyperlipidemia: Secondary | ICD-10-CM | POA: Diagnosis not present

## 2021-05-04 DIAGNOSIS — R7303 Prediabetes: Secondary | ICD-10-CM | POA: Diagnosis not present

## 2021-05-04 DIAGNOSIS — E039 Hypothyroidism, unspecified: Secondary | ICD-10-CM | POA: Diagnosis not present

## 2021-05-04 DIAGNOSIS — I10 Essential (primary) hypertension: Secondary | ICD-10-CM | POA: Diagnosis not present

## 2021-05-08 ENCOUNTER — Other Ambulatory Visit: Payer: Medicare Other

## 2021-05-08 ENCOUNTER — Other Ambulatory Visit: Payer: Self-pay

## 2021-05-08 ENCOUNTER — Telehealth: Payer: Self-pay

## 2021-05-08 DIAGNOSIS — R300 Dysuria: Secondary | ICD-10-CM | POA: Diagnosis not present

## 2021-05-08 LAB — URINALYSIS, ROUTINE W REFLEX MICROSCOPIC
Bilirubin, UA: NEGATIVE
Glucose, UA: NEGATIVE
Ketones, UA: NEGATIVE
Nitrite, UA: NEGATIVE
Protein,UA: NEGATIVE
Specific Gravity, UA: 1.025 (ref 1.005–1.030)
Urobilinogen, Ur: 0.2 mg/dL (ref 0.2–1.0)
pH, UA: 6.5 (ref 5.0–7.5)

## 2021-05-08 LAB — MICROSCOPIC EXAMINATION
Epithelial Cells (non renal): NONE SEEN /hpf (ref 0–10)
Renal Epithel, UA: NONE SEEN /hpf

## 2021-05-08 NOTE — Telephone Encounter (Signed)
Patient complaining of UTI symptoms. Placed on schedule for urine drop off.

## 2021-05-11 LAB — URINE CULTURE

## 2021-05-12 ENCOUNTER — Telehealth: Payer: Self-pay

## 2021-05-12 DIAGNOSIS — N390 Urinary tract infection, site not specified: Secondary | ICD-10-CM

## 2021-05-12 MED ORDER — NITROFURANTOIN MONOHYD MACRO 100 MG PO CAPS: 100.0000 mg | ORAL_CAPSULE | Freq: Two times a day (BID) | ORAL | 0 refills | Status: DC

## 2021-05-12 NOTE — Telephone Encounter (Signed)
-----   Message from Cleon Gustin, MD sent at 05/12/2021 10:13 AM EDT ----- Macrobid '100mg'$  BID for 7 days ----- Message ----- From: Dorisann Frames, RN Sent: 05/12/2021   8:49 AM EDT To: Cleon Gustin, MD  Please review.

## 2021-05-12 NOTE — Telephone Encounter (Signed)
Patient called and notified of results and prescription sent to pharmacy.

## 2021-05-22 ENCOUNTER — Telehealth: Payer: Self-pay

## 2021-05-22 ENCOUNTER — Other Ambulatory Visit: Payer: Self-pay

## 2021-05-22 ENCOUNTER — Other Ambulatory Visit: Payer: Medicare Other

## 2021-05-22 DIAGNOSIS — N390 Urinary tract infection, site not specified: Secondary | ICD-10-CM

## 2021-05-22 MED ORDER — SULFAMETHOXAZOLE-TRIMETHOPRIM 800-160 MG PO TABS: 1.0000 | ORAL_TABLET | Freq: Two times a day (BID) | ORAL | 0 refills | Status: DC

## 2021-05-22 NOTE — Telephone Encounter (Signed)
Patient called stating he completed macrobid 8/15. He is still having burning and discomfort with urination. Patient placed on lab scheduled for urine drop off.

## 2021-05-23 LAB — MICROSCOPIC EXAMINATION

## 2021-05-23 LAB — URINALYSIS, ROUTINE W REFLEX MICROSCOPIC
Bilirubin, UA: NEGATIVE
Ketones, UA: NEGATIVE
Nitrite, UA: NEGATIVE
Protein,UA: NEGATIVE
RBC, UA: NEGATIVE
Specific Gravity, UA: 1.025 (ref 1.005–1.030)
Urobilinogen, Ur: 0.2 mg/dL (ref 0.2–1.0)
pH, UA: 6 (ref 5.0–7.5)

## 2021-05-25 LAB — URINE CULTURE

## 2021-05-30 ENCOUNTER — Telehealth: Payer: Self-pay

## 2021-05-30 NOTE — Telephone Encounter (Signed)
-----   Message from Cleon Gustin, MD sent at 05/30/2021 10:50 AM EDT ----- Continue bactrim ----- Message ----- From: Iris Pert, LPN Sent: [id number]  10:30 AM EDT To: Cleon Gustin, MD  Patient started on Bactrim per Dr. Jeffie Pollock

## 2021-05-30 NOTE — Telephone Encounter (Signed)
Patient called and made aware.

## 2021-06-25 ENCOUNTER — Ambulatory Visit (INDEPENDENT_AMBULATORY_CARE_PROVIDER_SITE_OTHER): Payer: Medicare Other | Admitting: Urology

## 2021-06-25 ENCOUNTER — Other Ambulatory Visit: Payer: Self-pay

## 2021-06-25 ENCOUNTER — Encounter: Payer: Self-pay | Admitting: Urology

## 2021-06-25 VITALS — BP 156/80 | HR 81

## 2021-06-25 DIAGNOSIS — C672 Malignant neoplasm of lateral wall of bladder: Secondary | ICD-10-CM | POA: Diagnosis not present

## 2021-06-25 LAB — URINALYSIS, ROUTINE W REFLEX MICROSCOPIC
Bilirubin, UA: NEGATIVE
Ketones, UA: NEGATIVE
Nitrite, UA: NEGATIVE
Protein,UA: NEGATIVE
RBC, UA: NEGATIVE
Specific Gravity, UA: 1.025 (ref 1.005–1.030)
Urobilinogen, Ur: 0.2 mg/dL (ref 0.2–1.0)
pH, UA: 6 (ref 5.0–7.5)

## 2021-06-25 LAB — MICROSCOPIC EXAMINATION: RBC, Urine: NONE SEEN /hpf (ref 0–2)

## 2021-06-25 MED ORDER — CIPROFLOXACIN HCL 500 MG PO TABS
500.0000 mg | ORAL_TABLET | Freq: Once | ORAL | Status: AC
  Administered 2021-06-25: 500 mg via ORAL

## 2021-06-25 NOTE — Progress Notes (Signed)
   06/25/21  CC: followup bladder cancer   HPI: Mr Darrell Rivas is a 70yo here for followup for high grade bladder cancer diagnosed October 2021. No new LUTS. No hematuria or dysuria Blood pressure (!) 156/80, pulse 81. NED. A&Ox3.   No respiratory distress   Abd soft, NT, ND Normal phallus with bilateral descended testicles  Cystoscopy Procedure Note  Patient identification was confirmed, informed consent was obtained, and patient was prepped using Betadine solution.  Lidocaine jelly was administered per urethral meatus.     Pre-Procedure: - Inspection reveals a normal caliber ureteral meatus.  Procedure: The flexible cystoscope was introduced without difficulty - No urethral strictures/lesions are present. - Enlarged prostate  - Normal bladder neck - Bilateral ureteral orifices identified - Bladder mucosa  reveals no ulcers, tumors, or lesions - No bladder stones - No trabeculation  Retroflexion shows no intravesical prostatic protrusion   Post-Procedure: - Patient tolerated the procedure well  Assessment/ Plan:   Return in about 3 months (around 09/24/2021) for cystoscopy.  Nicolette Bang, MD

## 2021-06-25 NOTE — Patient Instructions (Signed)
Bladder Cancer Bladder cancer is a condition in which abnormal tissue (a tumor) grows in the bladder. The bladder is the organ that holds urine. Two tubes (ureters) carry the urine from the kidneys to the bladder. The bladder wall is made of layers of tissue. Cancer that spreads through these layers of the bladder wall becomes more difficult to treat. What are the causes? The cause of this condition is not known. What increases the risk? The following factors may make you more likely to develop this condition: Smoking. Working where there are risks (occupational exposures), such as working with rubber, leather, clothing fabric, dyes, chemicals, and paint. Being 55 years of age or older. Being male. Having bladder inflammation that is long-term (chronic). Having a history of cancer, including: A family history of bladder cancer. Personal experience with bladder cancer. Having had certain treatments for cancer before. These include: Medicines to kill cancer cells (chemotherapy). Strong X-ray beams or capsules high in energy to kill cancer cells and shrink tumors (radiation therapy). Having been exposed to arsenic. This is a chemical element that can poison you. What are the signs or symptoms? Early symptoms of this condition include: Seeing blood in your urine. Feeling pain when urinating. Having infections of your urinary system (urinary tract infections or UTIs) that happen often. Having to urinate sooner or more often than usual. Later symptoms of this condition include: Not being able to urinate. Pain on one side of your lower back. Loss of appetite. Weight loss. Tiredness (fatigue). Swelling in your feet. Bone pain. How is this diagnosed? This condition is diagnosed based on: Your medical history. A physical exam. Lab tests, such as urine tests. Imaging tests. Your symptoms. You may also have other tests or procedures done, such as: A cystoscopy. A narrow tube is inserted  into your bladder through the organ that connects your bladder to the outside of your body (urethra). This is done to view the lining of your bladder for tumors. A biopsy. This procedure involves removing a tissue sample to look at it under a microscope to see if cancer is present. It is important to find out: How deeply into the bladder wall cancer has grown. Whether cancer has spread to any other parts of your body. This may require blood tests or imaging tests, such as a CT scan, MRI, bone scan, or X-rays. How is this treated? Your health care provider may recommend one or more types of treatment based on the stage of your cancer. The most common types of treatment are: Surgery to remove the cancer. Procedures that may be done include: Removing a tumor on the inside wall of the bladder (transurethral resection). Removing the bladder (cystectomy). Radiation therapy. This is often used together with chemotherapy. Chemotherapy. Immunotherapy. This uses medicines to help your immune system destroy cancer cells. Follow these instructions at home: Take over-the-counter and prescription medicines only as told by your health care provider. Eat a healthy diet. Some of your treatments might affect your appetite. Do not use any products that contain nicotine or tobacco, such as cigarettes, e-cigarettes, and chewing tobacco. If you need help quitting, ask your health care provider. Consider joining a support group. This may help you learn to cope with the stress of having bladder cancer. Tell your cancer care team if you develop side effects. Your team may be able to recommend ways to get relief. Keep all follow-up visits as told by your health care provider. This is important. Where to find more information American   Cancer Society: www.cancer.org National Cancer Institute (NCI): www.cancer.gov Contact a health care provider if: You have symptoms of a urinary tract infection. These  include: Fever. Chills. Weakness. Muscle aches. Pain in your abdomen. Urge to urinate that is stronger and happens more often than usual. Burning feeling in the bladder or urethra when you urinate. Get help right away if: There is blood in your urine. You cannot urinate. You have severe pain or other symptoms that do not go away. Summary Bladder cancer is a condition in which tumors grow in the bladder and cause illness. This condition is diagnosed based on your medical history, a physical exam, lab tests, imaging tests, and your symptoms. Your health care provider may recommend one or more types of treatment based on the stage of your cancer. Consider joining a support group. This may help you learn to cope with the stress of having bladder cancer. This information is not intended to replace advice given to you by your health care provider. Make sure you discuss any questions you have with your health care provider. Document Revised: 05/31/2019 Document Reviewed: 05/31/2019 Elsevier Patient Education  2022 Elsevier Inc.  

## 2021-06-25 NOTE — Progress Notes (Signed)
Urological Symptom Review  Patient is experiencing the following symptoms: Injury to kidneys/bladder   Review of Systems  Gastrointestinal (upper)  : Negative for upper GI symptoms  Gastrointestinal (lower) : Negative for lower GI symptoms  Constitutional : Negative for symptoms  Skin: Skin rash/lesion  Eyes: Negative for eye symptoms  Ear/Nose/Throat : Negative for Ear/Nose/Throat symptoms  Hematologic/Lymphatic: Negative for Hematologic/Lymphatic symptoms  Cardiovascular : Negative for cardiovascular symptoms  Respiratory : Negative for respiratory symptoms  Endocrine: Negative for endocrine symptoms  Musculoskeletal: Back pain  Neurological: Negative for neurological symptoms  Psychologic: Negative for psychiatric symptoms

## 2021-07-28 ENCOUNTER — Other Ambulatory Visit: Payer: Self-pay

## 2021-07-28 ENCOUNTER — Telehealth: Payer: Self-pay

## 2021-07-28 ENCOUNTER — Other Ambulatory Visit: Payer: Medicare Other

## 2021-07-28 DIAGNOSIS — N390 Urinary tract infection, site not specified: Secondary | ICD-10-CM

## 2021-07-28 LAB — URINALYSIS, ROUTINE W REFLEX MICROSCOPIC
Bilirubin, UA: NEGATIVE
Glucose, UA: NEGATIVE
Ketones, UA: NEGATIVE
Nitrite, UA: NEGATIVE
Protein,UA: NEGATIVE
RBC, UA: NEGATIVE
Specific Gravity, UA: 1.02 (ref 1.005–1.030)
Urobilinogen, Ur: 0.2 mg/dL (ref 0.2–1.0)
pH, UA: 6 (ref 5.0–7.5)

## 2021-07-28 LAB — MICROSCOPIC EXAMINATION
Epithelial Cells (non renal): NONE SEEN /hpf (ref 0–10)
RBC, Urine: NONE SEEN /hpf (ref 0–2)
Renal Epithel, UA: NONE SEEN /hpf
WBC, UA: >30 /hpf — AB (ref 0–5)

## 2021-07-28 MED ORDER — NITROFURANTOIN MONOHYD MACRO 100 MG PO CAPS: 100.0000 mg | ORAL_CAPSULE | Freq: Two times a day (BID) | ORAL | 0 refills | Status: DC

## 2021-07-28 NOTE — Telephone Encounter (Signed)
Patient states he thinks he may have a UTI. Pt place on lab schedule for urine drop off.

## 2021-07-28 NOTE — Progress Notes (Signed)
Reviewed urine drop off with Dr. Alyson Ingles,  New order for macrobid 100mg  BID #14 per Dr. Alyson Ingles, pending culture.  Medication sent to pharmacy. Patient called and notified.

## 2021-07-31 LAB — URINE CULTURE

## 2021-08-11 ENCOUNTER — Telehealth: Payer: Self-pay

## 2021-08-11 NOTE — Telephone Encounter (Signed)
-----   Message from Cleon Gustin, MD sent at 08/09/2021  6:17 AM EDT ----- Continue macrobid ----- Message ----- From: Iris Pert, LPN Sent: [id number]   8:08 AM EDT To: Cleon Gustin, MD  Patient started on [facility]

## 2021-08-21 DIAGNOSIS — R300 Dysuria: Secondary | ICD-10-CM | POA: Diagnosis not present

## 2021-08-21 DIAGNOSIS — C679 Malignant neoplasm of bladder, unspecified: Secondary | ICD-10-CM | POA: Diagnosis not present

## 2021-08-21 DIAGNOSIS — E1169 Type 2 diabetes mellitus with other specified complication: Secondary | ICD-10-CM | POA: Diagnosis not present

## 2021-09-23 DIAGNOSIS — R7303 Prediabetes: Secondary | ICD-10-CM | POA: Diagnosis not present

## 2021-09-23 DIAGNOSIS — E7849 Other hyperlipidemia: Secondary | ICD-10-CM | POA: Diagnosis not present

## 2021-09-23 DIAGNOSIS — E1169 Type 2 diabetes mellitus with other specified complication: Secondary | ICD-10-CM | POA: Diagnosis not present

## 2021-09-23 DIAGNOSIS — E041 Nontoxic single thyroid nodule: Secondary | ICD-10-CM | POA: Diagnosis not present

## 2021-09-23 DIAGNOSIS — E782 Mixed hyperlipidemia: Secondary | ICD-10-CM | POA: Diagnosis not present

## 2021-09-23 DIAGNOSIS — E039 Hypothyroidism, unspecified: Secondary | ICD-10-CM | POA: Diagnosis not present

## 2021-09-24 ENCOUNTER — Encounter: Payer: Self-pay | Admitting: Urology

## 2021-09-24 ENCOUNTER — Ambulatory Visit (INDEPENDENT_AMBULATORY_CARE_PROVIDER_SITE_OTHER): Payer: Medicare Other | Admitting: Urology

## 2021-09-24 ENCOUNTER — Other Ambulatory Visit: Payer: Self-pay

## 2021-09-24 VITALS — BP 138/81 | HR 80

## 2021-09-24 DIAGNOSIS — R300 Dysuria: Secondary | ICD-10-CM

## 2021-09-24 DIAGNOSIS — C672 Malignant neoplasm of lateral wall of bladder: Secondary | ICD-10-CM

## 2021-09-24 DIAGNOSIS — R310 Gross hematuria: Secondary | ICD-10-CM

## 2021-09-24 LAB — MICROSCOPIC EXAMINATION
Bacteria, UA: NONE SEEN
RBC, Urine: NONE SEEN /hpf (ref 0–2)
Renal Epithel, UA: NONE SEEN /hpf

## 2021-09-24 LAB — URINALYSIS, ROUTINE W REFLEX MICROSCOPIC
Bilirubin, UA: NEGATIVE
Glucose, UA: NEGATIVE
Ketones, UA: NEGATIVE
Nitrite, UA: NEGATIVE
Protein,UA: NEGATIVE
RBC, UA: NEGATIVE
Specific Gravity, UA: 1.01 (ref 1.005–1.030)
Urobilinogen, Ur: 0.2 mg/dL (ref 0.2–1.0)
pH, UA: 5.5 (ref 5.0–7.5)

## 2021-09-24 NOTE — Progress Notes (Signed)

## 2021-09-24 NOTE — Patient Instructions (Signed)
Bladder Cancer Bladder cancer is a condition in which abnormal tissue (a tumor) grows in the bladder. The bladder is the organ that holds urine. Two tubes (ureters) carry the urine from the kidneys to the bladder. The bladder wall is made of layers of tissue. Cancer that spreads through these layers of the bladder wall becomes more difficult to treat. What are the causes? The cause of this condition is not known. What increases the risk? The following factors may make you more likely to develop this condition: Smoking. Working where there are risks (occupational exposures), such as working with rubber, leather, clothing fabric, dyes, chemicals, and paint. Being 72 years of age or older. Being male. Having bladder inflammation that is long-term (chronic). Having a history of cancer, including: A family history of bladder cancer. Personal experience with bladder cancer. Having had certain treatments for cancer before. These include: Medicines to kill cancer cells (chemotherapy). Strong X-ray beams or capsules high in energy to kill cancer cells and shrink tumors (radiation therapy). Having been exposed to arsenic. This is a chemical element that can poison you. What are the signs or symptoms? Early symptoms of this condition include: Seeing blood in your urine. Feeling pain when urinating. Having infections of your urinary system (urinary tract infections or UTIs) that happen often. Having to urinate sooner or more often than usual. Later symptoms of this condition include: Not being able to urinate. Pain on one side of your lower back. Loss of appetite. Weight loss. Tiredness (fatigue). Swelling in your feet. Bone pain. How is this diagnosed? This condition is diagnosed based on: Your medical history. A physical exam. Lab tests, such as urine tests. Imaging tests. Your symptoms. You may also have other tests or procedures done, such as: A cystoscopy. A narrow tube is inserted  into your bladder through the organ that connects your bladder to the outside of your body (urethra). This is done to view the lining of your bladder for tumors. A biopsy. This procedure involves removing a tissue sample to look at it under a microscope to see if cancer is present. It is important to find out: How deeply into the bladder wall cancer has grown. Whether cancer has spread to any other parts of your body. This may require blood tests or imaging tests, such as a CT scan, MRI, bone scan, or X-rays. How is this treated? Your health care provider may recommend one or more types of treatment based on the stage of your cancer. The most common types of treatment are: Surgery to remove the cancer. Procedures that may be done include: Removing a tumor on the inside wall of the bladder (transurethral resection). Removing the bladder (cystectomy). Radiation therapy. This is often used together with chemotherapy. Chemotherapy. Immunotherapy. This uses medicines to help your immune system destroy cancer cells. Follow these instructions at home: Take over-the-counter and prescription medicines only as told by your health care provider. Eat a healthy diet. Some of your treatments might affect your appetite. Do not use any products that contain nicotine or tobacco, such as cigarettes, e-cigarettes, and chewing tobacco. If you need help quitting, ask your health care provider. Consider joining a support group. This may help you learn to cope with the stress of having bladder cancer. Tell your cancer care team if you develop side effects. Your team may be able to recommend ways to get relief. Keep all follow-up visits as told by your health care provider. This is important. Where to find more information American   Cancer Society: www.cancer.org National Cancer Institute (NCI): www.cancer.gov Contact a health care provider if: You have symptoms of a urinary tract infection. These  include: Fever. Chills. Weakness. Muscle aches. Pain in your abdomen. Urge to urinate that is stronger and happens more often than usual. Burning feeling in the bladder or urethra when you urinate. Get help right away if: There is blood in your urine. You cannot urinate. You have severe pain or other symptoms that do not go away. Summary Bladder cancer is a condition in which tumors grow in the bladder and cause illness. This condition is diagnosed based on your medical history, a physical exam, lab tests, imaging tests, and your symptoms. Your health care provider may recommend one or more types of treatment based on the stage of your cancer. Consider joining a support group. This may help you learn to cope with the stress of having bladder cancer. This information is not intended to replace advice given to you by your health care provider. Make sure you discuss any questions you have with your health care provider. Document Revised: 05/31/2019 Document Reviewed: 05/31/2019 Elsevier Patient Education  2022 Elsevier Inc.  

## 2021-09-24 NOTE — Progress Notes (Signed)
° °  09/24/21  CC: Followup bladder cancer   HPI: Mr Riviello is a 70yo here for followup for high grade bladder cancer diagnosed in 07/2020 Blood pressure 138/81, pulse 80. NED. A&Ox3.   No respiratory distress   Abd soft, NT, ND Normal phallus with bilateral descended testicles  Cystoscopy Procedure Note  Patient identification was confirmed, informed consent was obtained, and patient was prepped using Betadine solution.  Lidocaine jelly was administered per urethral meatus.     Pre-Procedure: - Inspection reveals a normal caliber ureteral meatus.  Procedure: The flexible cystoscope was introduced without difficulty - No urethral strictures/lesions are present. - Enlarged prostate  - Normal bladder neck - Bilateral ureteral orifices identified - Bladder mucosa  reveals no ulcers, tumors, or lesions - No bladder stones - No trabeculation    Post-Procedure: - Patient tolerated the procedure well  Assessment/ Plan: RTC 3 months for cystoscopy  No follow-ups on file.  Nicolette Bang, MD

## 2021-09-25 DIAGNOSIS — M48061 Spinal stenosis, lumbar region without neurogenic claudication: Secondary | ICD-10-CM | POA: Diagnosis not present

## 2021-09-25 DIAGNOSIS — E1169 Type 2 diabetes mellitus with other specified complication: Secondary | ICD-10-CM | POA: Diagnosis not present

## 2021-09-25 DIAGNOSIS — I700 Atherosclerosis of aorta: Secondary | ICD-10-CM | POA: Diagnosis not present

## 2021-09-25 DIAGNOSIS — E7849 Other hyperlipidemia: Secondary | ICD-10-CM | POA: Diagnosis not present

## 2021-09-25 DIAGNOSIS — E039 Hypothyroidism, unspecified: Secondary | ICD-10-CM | POA: Diagnosis not present

## 2021-09-25 DIAGNOSIS — Z0001 Encounter for general adult medical examination with abnormal findings: Secondary | ICD-10-CM | POA: Diagnosis not present

## 2021-09-25 DIAGNOSIS — C679 Malignant neoplasm of bladder, unspecified: Secondary | ICD-10-CM | POA: Diagnosis not present

## 2021-09-25 DIAGNOSIS — K760 Fatty (change of) liver, not elsewhere classified: Secondary | ICD-10-CM | POA: Diagnosis not present

## 2021-10-01 ENCOUNTER — Other Ambulatory Visit: Payer: Medicare Other

## 2021-10-01 ENCOUNTER — Other Ambulatory Visit: Payer: Self-pay

## 2021-10-01 ENCOUNTER — Telehealth: Payer: Self-pay

## 2021-10-01 DIAGNOSIS — N390 Urinary tract infection, site not specified: Secondary | ICD-10-CM

## 2021-10-01 LAB — URINALYSIS, ROUTINE W REFLEX MICROSCOPIC
Bilirubin, UA: NEGATIVE
Nitrite, UA: NEGATIVE
Protein,UA: NEGATIVE
RBC, UA: NEGATIVE
Specific Gravity, UA: 1.02 (ref 1.005–1.030)
Urobilinogen, Ur: 0.2 mg/dL (ref 0.2–1.0)
pH, UA: 7 (ref 5.0–7.5)

## 2021-10-01 LAB — MICROSCOPIC EXAMINATION
Epithelial Cells (non renal): >10 /hpf — AB (ref 0–10)
RBC, Urine: NONE SEEN /hpf (ref 0–2)
Renal Epithel, UA: NONE SEEN /hpf
WBC, UA: >30 /hpf — AB (ref 0–5)

## 2021-10-01 NOTE — Telephone Encounter (Signed)
Pt called filled like he may have uti , he had csyto done last week . Pt is coming to urine drop off and culture .

## 2021-10-02 ENCOUNTER — Other Ambulatory Visit: Payer: Self-pay | Admitting: Physician Assistant

## 2021-10-02 MED ORDER — NITROFURANTOIN MONOHYD MACRO 100 MG PO CAPS: 100.0000 mg | ORAL_CAPSULE | Freq: Two times a day (BID) | ORAL | 0 refills | Status: AC

## 2021-10-02 NOTE — Progress Notes (Signed)
Pt symptomatic for UTI post cyst one week ago.  UA concerning for infection. Culture ordered Macrobid x 14 days sent to pharmacy

## 2021-10-03 ENCOUNTER — Telehealth: Payer: Self-pay

## 2021-10-03 NOTE — Telephone Encounter (Signed)
Left message of medication sent to pharmacy and culture pending.

## 2021-10-03 NOTE — Telephone Encounter (Signed)
-----   Message from Reynaldo Minium, [place] sent at 10/02/2021  1:10 PM EST ----- Please let pt know we are sending a Rx for antibx to his pharmacy. Culture is pending and tx may change once we see the result ----- Message ----- From: Dorisann Frames, RN Sent: 10/01/2021   5:50 PM EST To: Cleon Gustin, MD, #  Urine culture pending. UA drop off

## 2021-10-04 LAB — URINE CULTURE

## 2021-10-08 ENCOUNTER — Telehealth: Payer: Self-pay

## 2021-10-08 NOTE — Telephone Encounter (Signed)
-----   Message from Reynaldo Minium, [place] sent at 10/08/2021  8:14 AM EST ----- Culture indicates Macrobid is appropriate.   ----- Message ----- From: Dorisann Frames, RN Sent: 10/06/2021  10:08 AM EST To: Berneice Heinrich [place], PA-C  Patient was placed on macrobid

## 2021-10-08 NOTE — Telephone Encounter (Signed)
Patient was called, he was understanding to continue taking macrobid.

## 2021-10-29 ENCOUNTER — Encounter: Payer: Self-pay | Admitting: Urology

## 2021-10-29 ENCOUNTER — Other Ambulatory Visit: Payer: Self-pay

## 2021-10-29 ENCOUNTER — Ambulatory Visit (INDEPENDENT_AMBULATORY_CARE_PROVIDER_SITE_OTHER): Payer: Medicare Other | Admitting: Urology

## 2021-10-29 VITALS — BP 147/72 | HR 71

## 2021-10-29 DIAGNOSIS — N3021 Other chronic cystitis with hematuria: Secondary | ICD-10-CM | POA: Diagnosis not present

## 2021-10-29 DIAGNOSIS — N390 Urinary tract infection, site not specified: Secondary | ICD-10-CM | POA: Diagnosis not present

## 2021-10-29 LAB — URINALYSIS, ROUTINE W REFLEX MICROSCOPIC
Bilirubin, UA: NEGATIVE
Glucose, UA: NEGATIVE
Ketones, UA: NEGATIVE
Nitrite, UA: NEGATIVE
Protein,UA: NEGATIVE
RBC, UA: NEGATIVE
Specific Gravity, UA: 1.02 (ref 1.005–1.030)
Urobilinogen, Ur: 0.2 mg/dL (ref 0.2–1.0)
pH, UA: 5 (ref 5.0–7.5)

## 2021-10-29 LAB — MICROSCOPIC EXAMINATION
Epithelial Cells (non renal): NONE SEEN /hpf (ref 0–10)
RBC, Urine: NONE SEEN /hpf (ref 0–2)
Renal Epithel, UA: NONE SEEN /hpf
WBC, UA: >30 /hpf — AB (ref 0–5)

## 2021-10-29 LAB — BLADDER SCAN AMB NON-IMAGING: Scan Result: 1

## 2021-10-29 MED ORDER — SULFAMETHOXAZOLE-TRIMETHOPRIM 800-160 MG PO TABS: 1.0000 | ORAL_TABLET | Freq: Two times a day (BID) | ORAL | 0 refills | Status: DC

## 2021-10-29 NOTE — Progress Notes (Signed)
10/29/2021 9:08 AM   Darrell Rivas 12/09/1950 [id number]  Referring provider: Curlene Labrum, MD [place],  [place] [address]  Urinary urgency   HPI: Darrell Rivas is a 71yo here for evalaution of UTI and urinary urgency.  Dec 31st he was diagnosed with a UTI and finished 10 day course of macrobid. He LUTS slightly improved but then when he stopped the antibiotic a pelvic pressure, urinary urgency and feeling of incomplete emptying. PVR 0cc. UA today shows WBCs and bacteria. He has 2 prior prostate infections. No dysuria or hematuria   PMH: Past Medical History:  Diagnosis Date   Diabetes mellitus without complication ([place] [facility])    Hypercholesteremia    Hypothyroidism    Kidney stones    Sleep apnea    Thyroid disease     Surgical History: Past Surgical History:  Procedure Laterality Date   COLONOSCOPY     CYSTOSCOPY Left 08/05/2020   Procedure: CYSTOSCOPY with REMOVAL OF LEFT URETERAL STENT;  Surgeon: Cleon Gustin, MD;  Location: [facility];  Service: Urology;  Laterality: Left;   CYSTOSCOPY W/ URETERAL STENT PLACEMENT Bilateral 07/08/2020   Procedure: CYSTOSCOPY WITH BILATERAL RETROGRADE PYELOGRAM/ AND LEFT URETERAL STENT PLACEMENT;  Surgeon: Cleon Gustin, MD;  Location: [facility];  Service: Urology;  Laterality: Bilateral;   TRANSURETHRAL RESECTION OF BLADDER TUMOR N/A 07/08/2020   Procedure: TRANSURETHRAL RESECTION OF BLADDER TUMOR (TURBT);  Surgeon: Cleon Gustin, MD;  Location: [facility];  Service: Urology;  Laterality: N/A;   TRANSURETHRAL RESECTION OF BLADDER TUMOR N/A 08/05/2020   Procedure: CYSTOSCOPY WITH TRANSURETHRAL RESECTION OF BLADDER TUMOR (TURBT);  Surgeon: Cleon Gustin, MD;  Location: [facility];  Service: Urology;  Laterality: N/A;    Home Medications:  Allergies as of 10/29/2021   Not on File      Medication List        Accurate as of October 29, 2021  9:08 AM. If you have any questions, ask your nurse or doctor.           atorvastatin 20 MG tablet Commonly known as: LIPITOR Take 20 mg by mouth daily.   atorvastatin 10 MG tablet Commonly known as: LIPITOR Take 10 mg by mouth daily.   Euthyrox 125 MCG tablet Generic drug: levothyroxine Take 125 mcg by mouth daily before breakfast.   latanoprost 0.005 % ophthalmic solution Commonly known as: XALATAN Place 1 drop into both eyes at bedtime.   metFORMIN 500 MG tablet Commonly known as: GLUCOPHAGE Take 500 mg by mouth 2 (two) times daily with a meal.   multivitamin with minerals Tabs tablet Take 1 tablet by mouth daily.        Allergies: Not on File  Family History: Family History  Problem Relation Age of Onset   Cancer Mother    Stroke Father    Cancer Sister    Diabetes Maternal Grandmother     Social History:  reports that he has never smoked. He has never used smokeless tobacco. He reports that he does not drink alcohol and does not use drugs.  ROS: All other review of systems were reviewed and are negative except what is noted above in HPI  Physical Exam: BP (!) 147/72    Pulse 71   Constitutional:  Alert and oriented, No acute distress. HEENT: [place] AT, moist mucus membranes.  Trachea midline, no masses. Cardiovascular: No clubbing, cyanosis, or edema. Respiratory: Normal respiratory effort, no increased work of breathing. GI: Abdomen is soft, nontender,  nondistended, no abdominal masses GU: No CVA tenderness.  Lymph: No cervical or inguinal lymphadenopathy. Skin: No rashes, bruises or suspicious lesions. Neurologic: Grossly intact, no focal deficits, moving all 4 extremities. Psychiatric: Normal mood and affect.  Laboratory Data: Lab Results  Component Value Date   WBC 6.4 07/05/2020   HGB 13.6 07/05/2020   HCT 41.5 07/05/2020   MCV 93.0 07/05/2020   PLT 185 07/05/2020    Lab Results  Component Value Date   CREATININE 1.00 08/01/2020    No results found for: PSA  No results found for: TESTOSTERONE  Lab  Results  Component Value Date   HGBA1C 6.4 (H) 08/01/2020    Urinalysis    Component Value Date/Time   APPEARANCEUR Clear 10/01/2021 1315   GLUCOSEU Trace (A) 10/01/2021 1315   BILIRUBINUR Negative 10/01/2021 1315   PROTEINUR Negative 10/01/2021 1315   UROBILINOGEN 0.2 10/09/2020 1104   NITRITE Negative 10/01/2021 1315   LEUKOCYTESUR 2+ (A) 10/01/2021 1315    Lab Results  Component Value Date   LABMICR See below: 10/01/2021   WBCUA >30 (A) 10/01/2021   LABEPIT >10 (A) 10/01/2021   MUCUS Present 05/08/2021   BACTERIA Moderate (A) 10/01/2021    Pertinent Imaging:  No results found for this or any previous visit.  No results found for this or any previous visit.  No results found for this or any previous visit.  No results found for this or any previous visit.  No results found for this or any previous visit.  No results found for this or any previous visit.  Results for orders placed during the hospital encounter of 05/20/20  CT HEMATURIA WORKUP  Narrative CLINICAL DATA:  Gross hematuria. Began over 6 weeks ago with history of uric acid calculi.  EXAM: CT ABDOMEN AND PELVIS WITHOUT AND WITH CONTRAST  TECHNIQUE: Multidetector CT imaging of the abdomen and pelvis was performed following the standard protocol before and following the bolus administration of intravenous contrast.  CONTRAST:  154mL OMNIPAQUE IOHEXOL 300 MG/ML  SOLN  COMPARISON:  June 28, 2019  FINDINGS: Lower chest: Lung bases are clear.  Hepatobiliary: Low-density hepatic parenchyma compatible with steatosis, lobular hepatic contours without focal, suspicious hepatic lesion. Portal vein is patent. Hepatic veins are patent.  Pancreas: Pancreas is normal.  Spleen: Granulomatous changes in the normal size spleen.  Adrenals/Urinary Tract: Adrenal glands are normal.  Nephrolithiasis on the LEFT 3-4 mm calculus in the lower pole LEFT kidney. No ureteral calculi. Urinary bladder is  under distended limiting assessment. There is some nodularity however at the LEFT bladder base this is near the LEFT ureterovesicular orifice best seen on images 80 of series 3 and image 89 of series 11.  Also seen on delayed phase imaging is a small nodular filling defect (image 80, series 4), additionally seen on coronal dataset (image 86, series 15)  Stomach/Bowel: No acute gastrointestinal process. Colonic diverticulosis. Stable appearance of central mesenteric stranding and scattered small lymph nodes in the small bowel mesentery.  Vascular/Lymphatic: Normal caliber abdominal aorta no adenopathy in the retroperitoneum or in the upper abdomen. No pelvic adenopathy.  Reproductive: Prostate with mild heterogeneity. No pelvic adenopathy.  Other: Small fat containing umbilical hernia.  Musculoskeletal: No acute bone finding. No destructive bone process. Spinal degenerative changes.  IMPRESSION: 1. Nodular filling defect at the LEFT bladder base with perceived enhancement, suspicious for small urothelial neoplasm. Cystoscopy is suggested for further evaluation. 2. Nephrolithiasis on the LEFT. 3. Hepatic steatosis. 4. Colonic diverticulosis. 5. Small fat  containing umbilical hernia. 6. Aortic atherosclerosis.  These results will be called to the ordering clinician or representative by the Radiologist Assistant, and communication documented in the PACS or [employer].  Aortic Atherosclerosis (ICD10-I70.0).   Electronically Signed By: Zetta Bills M.D. On: 05/20/2020 16:20  No results found for this or any previous visit.   Assessment & Plan:    1. Recurrent UTI/prostatitis -Urine for culture -Bactrim DS BID for 3 weeks - BLADDER SCAN AMB NON-IMAGING - Urinalysis, Routine w reflex microscopic     No follow-ups on file.  Nicolette Bang, MD  [facility] [place]

## 2021-10-29 NOTE — Progress Notes (Signed)
post void residual= 1 Urological Symptom Review  Patient is experiencing the following symptoms: Frequent urination Urinary tract infection Erection problems (male only)   Review of Systems  Gastrointestinal (upper)  : Negative for upper GI symptoms  Gastrointestinal (lower) : Negative for lower GI symptoms  Constitutional : Negative for symptoms  Skin: Negative for skin symptoms  Eyes: Negative for eye symptoms  Ear/Nose/Throat : Negative for Ear/Nose/Throat symptoms  Hematologic/Lymphatic: Negative for Hematologic/Lymphatic symptoms  Cardiovascular : Negative for cardiovascular symptoms  Respiratory : Negative for respiratory symptoms  Endocrine: Negative for endocrine symptoms  Musculoskeletal: Back pain  Neurological: Negative for neurological symptoms  Psychologic: Negative for psychiatric symptoms

## 2021-10-29 NOTE — Patient Instructions (Signed)

## 2021-10-31 LAB — URINE CULTURE

## 2021-11-03 ENCOUNTER — Telehealth: Payer: Self-pay

## 2021-11-03 NOTE — Telephone Encounter (Signed)
-----   Message from Reynaldo Minium, [place] sent at 11/03/2021  8:47 AM EST ----- Please let pt know his cx indicates he's on the correct antibx ----- Message ----- From: Dorisann Frames, RN Sent: 10/31/2021   3:29 PM EST To: Berneice Heinrich [place], PA-C  Placed on bactrim 1/25

## 2021-11-03 NOTE — Telephone Encounter (Signed)
Patient called and made aware.

## 2021-11-26 ENCOUNTER — Ambulatory Visit: Payer: Medicare Other | Admitting: Physician Assistant

## 2021-12-01 ENCOUNTER — Encounter: Payer: Self-pay | Admitting: Urology

## 2021-12-01 ENCOUNTER — Other Ambulatory Visit: Payer: Self-pay

## 2021-12-01 ENCOUNTER — Ambulatory Visit (INDEPENDENT_AMBULATORY_CARE_PROVIDER_SITE_OTHER): Payer: Medicare Other | Admitting: Urology

## 2021-12-01 VITALS — BP 162/74 | HR 77 | Wt 245.4 lb

## 2021-12-01 DIAGNOSIS — N3021 Other chronic cystitis with hematuria: Secondary | ICD-10-CM | POA: Diagnosis not present

## 2021-12-01 LAB — URINALYSIS, ROUTINE W REFLEX MICROSCOPIC
Bilirubin, UA: NEGATIVE
Glucose, UA: NEGATIVE
Ketones, UA: NEGATIVE
Leukocytes,UA: NEGATIVE
Nitrite, UA: NEGATIVE
Protein,UA: NEGATIVE
RBC, UA: NEGATIVE
Specific Gravity, UA: >1.03 — ABNORMAL HIGH (ref 1.005–1.030)
Urobilinogen, Ur: 0.2 mg/dL (ref 0.2–1.0)
pH, UA: 5.5 (ref 5.0–7.5)

## 2021-12-01 NOTE — Progress Notes (Signed)
12/01/2021 10:08 AM   [name] [birth year] [id number]  Referring provider: Curlene Labrum, MD [place],  [place] [address]  Followup prostatitias   HPI: Mr Darrell Rivas is a 71yo here for followup for chronic prostatitis. He finished 3 week course of bactrim and his LUTS resolved. IPSS 5 QOL 1. Urine stream strong. Nocturia 1x. No urinary urgency.  No other complaints today   PMH: Past Medical History:  Diagnosis Date   Diabetes mellitus without complication ([place])    Hypercholesteremia    Hypothyroidism    Kidney stones    Sleep apnea    Thyroid disease     Surgical History: Past Surgical History:  Procedure Laterality Date   COLONOSCOPY     CYSTOSCOPY Left 08/05/2020   Procedure: CYSTOSCOPY with REMOVAL OF LEFT URETERAL STENT;  Surgeon: Cleon Gustin, MD;  Location: [facility];  Service: Urology;  Laterality: Left;   CYSTOSCOPY W/ URETERAL STENT PLACEMENT Bilateral 07/08/2020   Procedure: CYSTOSCOPY WITH BILATERAL RETROGRADE PYELOGRAM/ AND LEFT URETERAL STENT PLACEMENT;  Surgeon: Cleon Gustin, MD;  Location: [facility];  Service: Urology;  Laterality: Bilateral;   TRANSURETHRAL RESECTION OF BLADDER TUMOR N/A 07/08/2020   Procedure: TRANSURETHRAL RESECTION OF BLADDER TUMOR (TURBT);  Surgeon: Cleon Gustin, MD;  Location: [facility];  Service: Urology;  Laterality: N/A;   TRANSURETHRAL RESECTION OF BLADDER TUMOR N/A 08/05/2020   Procedure: CYSTOSCOPY WITH TRANSURETHRAL RESECTION OF BLADDER TUMOR (TURBT);  Surgeon: Cleon Gustin, MD;  Location: [facility];  Service: Urology;  Laterality: N/A;    Home Medications:  Allergies as of 12/01/2021   No Known Allergies      Medication List        Accurate as of December 01, 2021 10:08 AM. If you have any questions, ask your nurse or doctor.          STOP taking these medications    sulfamethoxazole-trimethoprim 800-160 MG tablet Commonly known as: BACTRIM DS Stopped by: Nicolette Bang, MD        TAKE these medications    atorvastatin 10 MG tablet Commonly known as: LIPITOR Take 10 mg by mouth daily. What changed: Another medication with the same name was removed. Continue taking this medication, and follow the directions you see here. Changed by: Nicolette Bang, MD   Euthyrox 125 MCG tablet Generic drug: levothyroxine Take 125 mcg by mouth daily before breakfast.   latanoprost 0.005 % ophthalmic solution Commonly known as: XALATAN Place 1 drop into both eyes at bedtime.   metFORMIN 500 MG tablet Commonly known as: GLUCOPHAGE Take 500 mg by mouth 1 day or 1 dose. Pt is taking once a day What changed: Another medication with the same name was removed. Continue taking this medication, and follow the directions you see here. Changed by: Nicolette Bang, MD   multivitamin with minerals Tabs tablet Take 1 tablet by mouth daily.        Allergies: No Known Allergies  Family History: Family History  Problem Relation Age of Onset   Cancer Mother    Stroke Father    Cancer Sister    Diabetes Maternal Grandmother     Social History:  reports that he has never smoked. He has never used smokeless tobacco. He reports that he does not drink alcohol and does not use drugs.  ROS: All other review of systems were reviewed and are negative except what is noted above in HPI  Physical Exam: BP (!) 162/74  Pulse 77    Wt 245 lb 6.4 oz (111.3 kg)    BMI 38.44 kg/m   Constitutional:  Alert and oriented, No acute distress. HEENT: [place] AT, moist mucus membranes.  Trachea midline, no masses. Cardiovascular: No clubbing, cyanosis, or edema. Respiratory: Normal respiratory effort, no increased work of breathing. GI: Abdomen is soft, nontender, nondistended, no abdominal masses GU: No CVA tenderness.  Lymph: No cervical or inguinal lymphadenopathy. Skin: No rashes, bruises or suspicious lesions. Neurologic: Grossly intact, no focal deficits, moving all 4  extremities. Psychiatric: Normal mood and affect.  Laboratory Data: Lab Results  Component Value Date   WBC 6.4 07/05/2020   HGB 13.6 07/05/2020   HCT 41.5 07/05/2020   MCV 93.0 07/05/2020   PLT 185 07/05/2020    Lab Results  Component Value Date   CREATININE 1.00 08/01/2020    No results found for: PSA  No results found for: TESTOSTERONE  Lab Results  Component Value Date   HGBA1C 6.4 (H) 08/01/2020    Urinalysis    Component Value Date/Time   APPEARANCEUR Clear 10/29/2021 1027   GLUCOSEU Negative 10/29/2021 1027   BILIRUBINUR Negative 10/29/2021 1027   PROTEINUR Negative 10/29/2021 1027   UROBILINOGEN 0.2 10/09/2020 1104   NITRITE Negative 10/29/2021 1027   LEUKOCYTESUR 1+ (A) 10/29/2021 1027    Lab Results  Component Value Date   LABMICR See below: 10/29/2021   WBCUA >30 (A) 10/29/2021   LABEPIT None seen 10/29/2021   MUCUS Present 05/08/2021   BACTERIA Few 10/29/2021    Pertinent Imaging:  No results found for this or any previous visit.  No results found for this or any previous visit.  No results found for this or any previous visit.  No results found for this or any previous visit.  No results found for this or any previous visit.  No results found for this or any previous visit.  Results for orders placed during the hospital encounter of 05/20/20  CT HEMATURIA WORKUP  Narrative CLINICAL DATA:  Gross hematuria. Began over 6 weeks ago with history of uric acid calculi.  EXAM: CT ABDOMEN AND PELVIS WITHOUT AND WITH CONTRAST  TECHNIQUE: Multidetector CT imaging of the abdomen and pelvis was performed following the standard protocol before and following the bolus administration of intravenous contrast.  CONTRAST:  132mL OMNIPAQUE IOHEXOL 300 MG/ML  SOLN  COMPARISON:  June 28, 2019  FINDINGS: Lower chest: Lung bases are clear.  Hepatobiliary: Low-density hepatic parenchyma compatible with steatosis, lobular hepatic contours  without focal, suspicious hepatic lesion. Portal vein is patent. Hepatic veins are patent.  Pancreas: Pancreas is normal.  Spleen: Granulomatous changes in the normal size spleen.  Adrenals/Urinary Tract: Adrenal glands are normal.  Nephrolithiasis on the LEFT 3-4 mm calculus in the lower pole LEFT kidney. No ureteral calculi. Urinary bladder is under distended limiting assessment. There is some nodularity however at the LEFT bladder base this is near the LEFT ureterovesicular orifice best seen on images 80 of series 3 and image 89 of series 11.  Also seen on delayed phase imaging is a small nodular filling defect (image 80, series 4), additionally seen on coronal dataset (image 86, series 15)  Stomach/Bowel: No acute gastrointestinal process. Colonic diverticulosis. Stable appearance of central mesenteric stranding and scattered small lymph nodes in the small bowel mesentery.  Vascular/Lymphatic: Normal caliber abdominal aorta no adenopathy in the retroperitoneum or in the upper abdomen. No pelvic adenopathy.  Reproductive: Prostate with mild heterogeneity. No pelvic adenopathy.  Other:  Small fat containing umbilical hernia.  Musculoskeletal: No acute bone finding. No destructive bone process. Spinal degenerative changes.  IMPRESSION: 1. Nodular filling defect at the LEFT bladder base with perceived enhancement, suspicious for small urothelial neoplasm. Cystoscopy is suggested for further evaluation. 2. Nephrolithiasis on the LEFT. 3. Hepatic steatosis. 4. Colonic diverticulosis. 5. Small fat containing umbilical hernia. 6. Aortic atherosclerosis.  These results will be called to the ordering clinician or representative by the Radiologist Assistant, and communication documented in the PACS or [employer].  Aortic Atherosclerosis (ICD10-I70.0).   Electronically Signed By: Zetta Bills M.D. On: 05/20/2020 16:20  No results found for this or any previous  visit.   Assessment & Plan:    1. Chronic cystitis with hematuria -symptoms resolved after bactrim DS therapy.   No follow-ups on file.  Nicolette Bang, MD  [facility] [place]

## 2021-12-01 NOTE — Addendum Note (Signed)
Addended by: Dorisann Frames on: 12/01/2021 02:43 PM   Modules accepted: Orders

## 2021-12-01 NOTE — Patient Instructions (Signed)

## 2021-12-30 ENCOUNTER — Other Ambulatory Visit: Payer: Self-pay

## 2021-12-30 ENCOUNTER — Encounter: Payer: Self-pay | Admitting: Urology

## 2021-12-30 ENCOUNTER — Ambulatory Visit (INDEPENDENT_AMBULATORY_CARE_PROVIDER_SITE_OTHER): Payer: Medicare Other | Admitting: Urology

## 2021-12-30 VITALS — BP 147/80 | HR 73

## 2021-12-30 DIAGNOSIS — C672 Malignant neoplasm of lateral wall of bladder: Secondary | ICD-10-CM | POA: Diagnosis not present

## 2021-12-30 MED ORDER — CIPROFLOXACIN HCL 500 MG PO TABS
500.0000 mg | ORAL_TABLET | Freq: Once | ORAL | Status: AC
  Administered 2021-12-30: 500 mg via ORAL

## 2021-12-30 NOTE — Patient Instructions (Signed)
Bladder Cancer Bladder cancer is a condition in which abnormal tissue (a tumor) grows in the bladder. The bladder is the organ that holds urine. Two tubes (ureters) carry the urine from the kidneys to the bladder. The bladder wall is made of layers of tissue. Cancer that spreads through these layers of the bladder wall becomes more difficult to treat. What are the causes? The cause of this condition is not known. What increases the risk? The following factors may make you more likely to develop this condition: Smoking. Working where there are risks (occupational exposures), such as working with rubber, leather, clothing fabric, dyes, chemicals, and paint. Being 55 years of age or older. Being male. Having bladder inflammation that is long-term (chronic). Having a history of cancer, including: A family history of bladder cancer. Personal experience with bladder cancer. Having had certain treatments for cancer before. These include: Medicines to kill cancer cells (chemotherapy). Strong X-ray beams or capsules high in energy to kill cancer cells and shrink tumors (radiation therapy). Having been exposed to arsenic. This is a chemical element that can poison you. What are the signs or symptoms? Early symptoms of this condition include: Seeing blood in your urine. Feeling pain when urinating. Having infections of your urinary system (urinary tract infections or UTIs) that happen often. Having to urinate sooner or more often than usual. Later symptoms of this condition include: Not being able to urinate. Pain on one side of your lower back. Loss of appetite. Weight loss. Tiredness (fatigue). Swelling in your feet. Bone pain. How is this diagnosed? This condition is diagnosed based on: Your medical history. A physical exam. Lab tests, such as urine tests. Imaging tests. Your symptoms. You may also have other tests or procedures done, such as: A cystoscopy. A narrow tube is inserted  into your bladder through the organ that connects your bladder to the outside of your body (urethra). This is done to view the lining of your bladder for tumors. A biopsy. This procedure involves removing a tissue sample to look at it under a microscope to see if cancer is present. It is important to find out: How deeply into the bladder wall cancer has grown. Whether cancer has spread to any other parts of your body. This may require blood tests or imaging tests, such as a CT scan, MRI, bone scan, or X-rays. How is this treated? Your health care provider may recommend one or more types of treatment based on the stage of your cancer. The most common types of treatment are: Surgery to remove the cancer. Procedures that may be done include: Removing a tumor on the inside wall of the bladder (transurethral resection). Removing the bladder (cystectomy). Radiation therapy. This is often used together with chemotherapy. Chemotherapy. Immunotherapy. This uses medicines to help your immune system destroy cancer cells. Follow these instructions at home: Take over-the-counter and prescription medicines only as told by your health care provider. Eat a healthy diet. Some of your treatments might affect your appetite. Do not use any products that contain nicotine or tobacco, such as cigarettes, e-cigarettes, and chewing tobacco. If you need help quitting, ask your health care provider. Consider joining a support group. This may help you learn to cope with the stress of having bladder cancer. Tell your cancer care team if you develop side effects. Your team may be able to recommend ways to get relief. Keep all follow-up visits as told by your health care provider. This is important. Where to find more information American   Cancer Society: www.cancer.org National Cancer Institute (NCI): www.cancer.gov Contact a health care provider if: You have symptoms of a urinary tract infection. These  include: Fever. Chills. Weakness. Muscle aches. Pain in your abdomen. Urge to urinate that is stronger and happens more often than usual. Burning feeling in the bladder or urethra when you urinate. Get help right away if: There is blood in your urine. You cannot urinate. You have severe pain or other symptoms that do not go away. Summary Bladder cancer is a condition in which tumors grow in the bladder and cause illness. This condition is diagnosed based on your medical history, a physical exam, lab tests, imaging tests, and your symptoms. Your health care provider may recommend one or more types of treatment based on the stage of your cancer. Consider joining a support group. This may help you learn to cope with the stress of having bladder cancer. This information is not intended to replace advice given to you by your health care provider. Make sure you discuss any questions you have with your health care provider. Document Revised: 05/31/2019 Document Reviewed: 05/31/2019 Elsevier Patient Education  2022 Elsevier Inc.  

## 2021-12-30 NOTE — Progress Notes (Signed)
? ?  12/30/21 ? ?CC: followup bladder cancer ? ? ?HPI: ?Mr Farrior is a 71yo here for followup for high grade bladder cancer diagnosed in 07/2020. ?Blood pressure (!) 147/80, pulse 73. ?NED. A&Ox3.   ?No respiratory distress   ?Abd soft, NT, ND ?Normal phallus with bilateral descended testicles ? ?Cystoscopy Procedure Note ? ?Patient identification was confirmed, informed consent was obtained, and patient was prepped using Betadine solution.  Lidocaine jelly was administered per urethral meatus.   ? ? ?Pre-Procedure: ?- Inspection reveals a normal caliber ureteral meatus. ? ?Procedure: ?The flexible cystoscope was introduced without difficulty ?- No urethral strictures/lesions are present. ?- Enlarged prostate no median lobe ?- Normal bladder neck ?- Bilateral ureteral orifices identified ?- Bladder mucosa  reveals no ulcers, tumors, or lesions ?- No bladder stones ?- No trabeculation ? ?Retroflexion shows no intravesical prostatic protrusion ? ? ?Post-Procedure: ?- Patient tolerated the procedure well ? ?Assessment/ Plan: ?RTC 3 months for cystoscopy ? ?No follow-ups on file. ? ?Nicolette Bang, MD  ?

## 2021-12-31 LAB — URINALYSIS, ROUTINE W REFLEX MICROSCOPIC
Bilirubin, UA: NEGATIVE
Glucose, UA: NEGATIVE
Ketones, UA: NEGATIVE
Leukocytes,UA: NEGATIVE
Nitrite, UA: NEGATIVE
Protein,UA: NEGATIVE
RBC, UA: NEGATIVE
Specific Gravity, UA: 1.025 (ref 1.005–1.030)
Urobilinogen, Ur: 0.2 mg/dL (ref 0.2–1.0)
pH, UA: 5.5 (ref 5.0–7.5)

## 2022-02-09 ENCOUNTER — Ambulatory Visit (INDEPENDENT_AMBULATORY_CARE_PROVIDER_SITE_OTHER): Payer: Medicare Other | Admitting: Physician Assistant

## 2022-02-09 VITALS — BP 159/89 | HR 69 | Ht 67.0 in | Wt 240.0 lb

## 2022-02-09 DIAGNOSIS — Z8551 Personal history of malignant neoplasm of bladder: Secondary | ICD-10-CM | POA: Diagnosis not present

## 2022-02-09 DIAGNOSIS — N3000 Acute cystitis without hematuria: Secondary | ICD-10-CM

## 2022-02-09 DIAGNOSIS — N390 Urinary tract infection, site not specified: Secondary | ICD-10-CM

## 2022-02-09 DIAGNOSIS — C672 Malignant neoplasm of lateral wall of bladder: Secondary | ICD-10-CM

## 2022-02-09 DIAGNOSIS — N3080 Other cystitis without hematuria: Secondary | ICD-10-CM | POA: Diagnosis not present

## 2022-02-09 LAB — URINALYSIS, ROUTINE W REFLEX MICROSCOPIC
Bilirubin, UA: NEGATIVE
Ketones, UA: NEGATIVE
Nitrite, UA: NEGATIVE
Protein,UA: NEGATIVE
RBC, UA: NEGATIVE
Specific Gravity, UA: 1.025 (ref 1.005–1.030)
Urobilinogen, Ur: 0.2 mg/dL (ref 0.2–1.0)
pH, UA: 7 (ref 5.0–7.5)

## 2022-02-09 LAB — MICROSCOPIC EXAMINATION
RBC, Urine: NONE SEEN /hpf (ref 0–2)
Renal Epithel, UA: NONE SEEN /hpf
WBC, UA: >30 /hpf — AB (ref 0–5)

## 2022-02-09 MED ORDER — NITROFURANTOIN MONOHYD MACRO 100 MG PO CAPS: 100.0000 mg | ORAL_CAPSULE | Freq: Two times a day (BID) | ORAL | 0 refills | Status: AC

## 2022-02-09 NOTE — Progress Notes (Signed)
? ?Assessment: ?1. Recurrent UTI ?- Urinalysis, Routine w reflex microscopic ? ?2. Acute cystitis without hematuria ? ?3. Malignant neoplasm of lateral wall of urinary bladder (HCC) ?  ? ?Plan: ?Macrobid sent to pharmacy and urine sent for Mdx cx. Will adjust tx if indicated when cx results. Keep FU for cysto as scheduled for FU of bladder cancer. Consider 3 weeks of antibx if sxs recur ? ?Chief Complaint: ?No chief complaint on file. ? ? ?HPI: ?Darrell Rivas is a 71 y.o. male who presents for continued evaluation of recurring UTIs. Pt c/o "tingling" with voiding for a few weeks and was near this part of town today so he decided to schedule OV. No pain, hematuria, urgency, frequency, nocturia. No burning. Pt denies fever, chills, abdominal pain, Nausea. Recent cysto for FU of bladder Ca was okay. Last c/o UTI was 1/23. Urine cx grew 50-100K colonies of E Coli. Pan sensitive. ?IPSS=5. ? ?12/30/21 ?Cystoscopy for FU of high grade bladder cancer was unremarkable ? ?12/01/21 ?Mr Darrell Rivas is a 71yo here for followup for chronic prostatitis. He finished 3 week course of bactrim and his LUTS resolved. IPSS 5 QOL 1. Urine stream strong. Nocturia 1x. No urinary urgency.  No other complaints today ? ?10/29/21 ?Mr Darrell Rivas is a 71yo here for evalaution of UTI and urinary urgency.  Dec 31st he was diagnosed with a UTI and finished 10 day course of macrobid. He LUTS slightly improved but then when he stopped the antibiotic a pelvic pressure, urinary urgency and feeling of incomplete emptying. PVR 0cc. UA today shows WBCs and bacteria. He has 2 prior prostate infections. No dysuria or hematuria ?  ?  ?Portions of the above documentation were copied from a prior visit for review purposes only. ? ?Allergies: ?No Known Allergies ? ?PMH: ?Past Medical History:  ?Diagnosis Date  ? Diabetes mellitus without complication ([place])   ? Hypercholesteremia   ? Hypothyroidism   ? Kidney stones   ? Sleep apnea   ? Thyroid disease   ? ? ?PSH: ?Past  Surgical History:  ?Procedure Laterality Date  ? COLONOSCOPY    ? CYSTOSCOPY Left 08/05/2020  ? Procedure: CYSTOSCOPY with REMOVAL OF LEFT URETERAL STENT;  Surgeon: Cleon Gustin, MD;  Location: [facility];  Service: Urology;  Laterality: Left;  ? CYSTOSCOPY W/ URETERAL STENT PLACEMENT Bilateral 07/08/2020  ? Procedure: CYSTOSCOPY WITH BILATERAL RETROGRADE PYELOGRAM/ AND LEFT URETERAL STENT PLACEMENT;  Surgeon: Cleon Gustin, MD;  Location: [facility];  Service: Urology;  Laterality: Bilateral;  ? TRANSURETHRAL RESECTION OF BLADDER TUMOR N/A 07/08/2020  ? Procedure: TRANSURETHRAL RESECTION OF BLADDER TUMOR (TURBT);  Surgeon: Cleon Gustin, MD;  Location: [facility];  Service: Urology;  Laterality: N/A;  ? TRANSURETHRAL RESECTION OF BLADDER TUMOR N/A 08/05/2020  ? Procedure: CYSTOSCOPY WITH TRANSURETHRAL RESECTION OF BLADDER TUMOR (TURBT);  Surgeon: Cleon Gustin, MD;  Location: [facility];  Service: Urology;  Laterality: N/A;  ? ? ?SH: ?Social History  ? ?Tobacco Use  ? Smoking status: Never  ? Smokeless tobacco: Never  ?Vaping Use  ? Vaping Use: Never used  ?Substance Use Topics  ? Alcohol use: No  ? Drug use: Never  ? ? ?ROS: ?Constitutional:  Negative for fever, chills, weight loss ?CV: Negative for chest pain ?Respiratory:  Negative for shortness of breath, wheezing, sleep apnea, frequent cough ?GI:  Negative for nausea, vomiting, bloody stool, GERD ? ?PE: ?BP (!) 159/89   Pulse 69   Ht '5\' 7"'$  (1.702 m)  Wt 240 lb (108.9 kg)   BMI 37.59 kg/m?  ?GENERAL APPEARANCE:  Well appearing, well developed, well nourished, NAD ?HEENT:  Atraumatic, normocephalic ?NECK:  Supple. Trachea midline ?ABDOMEN:  Soft, non-tender, no masses ?EXTREMITIES:  Moves all extremities well, without clubbing, cyanosis, or edema ?NEUROLOGIC:  Alert and oriented x 3, normal gait, CN II-XII grossly intact ?MENTAL STATUS:  appropriate ?BACK:  Non-tender to palpation, No CVAT ?SKIN:  Warm, dry, and intact ? ? ?Results: ?Laboratory  Data: ?Lab Results  ?Component Value Date  ? WBC 6.4 07/05/2020  ? HGB 13.6 07/05/2020  ? HCT 41.5 07/05/2020  ? MCV 93.0 07/05/2020  ? PLT 185 07/05/2020  ? ? ?Lab Results  ?Component Value Date  ? CREATININE 1.00 08/01/2020  ? ? ?Lab Results  ?Component Value Date  ? HGBA1C 6.4 (H) 08/01/2020  ? ? ?Urinalysis ?   ?Component Value Date/Time  ? APPEARANCEUR Clear 12/30/2021 1014  ? GLUCOSEU Negative 12/30/2021 1014  ? BILIRUBINUR Negative 12/30/2021 1014  ? PROTEINUR Negative 12/30/2021 1014  ? UROBILINOGEN 0.2 10/09/2020 1104  ? NITRITE Negative 12/30/2021 1014  ? LEUKOCYTESUR Negative 12/30/2021 1014  ? ? ?Lab Results  ?Component Value Date  ? LABMICR Comment 12/01/2021  ? [place] >30 (A) 10/29/2021  ? LABEPIT None seen 10/29/2021  ? MUCUS Present 05/08/2021  ? BACTERIA Few 10/29/2021  ? ? ?Pertinent Imaging: ? ?No results found for this or any previous visit. ? ?No results found for this or any previous visit. ? ?No results found for this or any previous visit. ? ?No results found for this or any previous visit. ? ?No results found for this or any previous visit. ? ?No results found for this or any previous visit. ? ?Results for orders placed during the hospital encounter of 05/20/20 ? ?CT HEMATURIA WORKUP ? ?Narrative ?CLINICAL DATA:  Gross hematuria. Began over 6 weeks ago with history ?of uric acid calculi. ? ?EXAM: ?CT ABDOMEN AND PELVIS WITHOUT AND WITH CONTRAST ? ?TECHNIQUE: ?Multidetector CT imaging of the abdomen and pelvis was performed ?following the standard protocol before and following the bolus ?administration of intravenous contrast. ? ?CONTRAST:  121m OMNIPAQUE IOHEXOL 300 MG/ML  SOLN ? ?COMPARISON:  June 28, 2019 ? ?FINDINGS: ?Lower chest: Lung bases are clear. ? ?Hepatobiliary: Low-density hepatic parenchyma compatible with ?steatosis, lobular hepatic contours without focal, suspicious ?hepatic lesion. Portal vein is patent. Hepatic veins are patent. ? ?Pancreas: Pancreas is  normal. ? ?Spleen: Granulomatous changes in the normal size spleen. ? ?Adrenals/Urinary Tract: Adrenal glands are normal. ? ?Nephrolithiasis on the LEFT 3-4 mm calculus in the lower pole LEFT ?kidney. No ureteral calculi. Urinary bladder is under distended ?limiting assessment. There is some nodularity however at the LEFT ?bladder base this is near the LEFT ureterovesicular orifice best ?seen on images 80 of series 3 and image 89 of series 11. ? ?Also seen on delayed phase imaging is a small nodular filling defect ?(image 80, series 4), additionally seen on coronal dataset (image ?86, series 15) ? ?Stomach/Bowel: No acute gastrointestinal process. Colonic ?diverticulosis. Stable appearance of central mesenteric stranding ?and scattered small lymph nodes in the small bowel mesentery. ? ?Vascular/Lymphatic: Normal caliber abdominal aorta no adenopathy in ?the retroperitoneum or in the upper abdomen. No pelvic adenopathy. ? ?Reproductive: Prostate with mild heterogeneity. No pelvic ?adenopathy. ? ?Other: Small fat containing umbilical hernia. ? ?Musculoskeletal: No acute bone finding. No destructive bone process. ?Spinal degenerative changes. ? ?IMPRESSION: ?1. Nodular filling defect at the LEFT bladder base with perceived ?  enhancement, suspicious for small urothelial neoplasm. Cystoscopy is ?suggested for further evaluation. ?2. Nephrolithiasis on the LEFT. ?3. Hepatic steatosis. ?4. Colonic diverticulosis. ?5. Small fat containing umbilical hernia. ?6. Aortic atherosclerosis. ? ?These results will be called to the ordering clinician or ?representative by the Radiologist Assistant, and communication ?documented in the PACS or [employer]. ? ?Aortic Atherosclerosis (ICD10-I70.0). ? ? ?Electronically Signed ?By: Zetta Bills M.D. ?On: 05/20/2020 16:20 ? ?No results found for this or any previous visit. ? ?No results found for this or any previous visit (from the past 24 hour(s)).  ?

## 2022-02-20 DIAGNOSIS — E782 Mixed hyperlipidemia: Secondary | ICD-10-CM | POA: Diagnosis not present

## 2022-02-20 DIAGNOSIS — R5382 Chronic fatigue, unspecified: Secondary | ICD-10-CM | POA: Diagnosis not present

## 2022-02-20 DIAGNOSIS — E7849 Other hyperlipidemia: Secondary | ICD-10-CM | POA: Diagnosis not present

## 2022-02-20 DIAGNOSIS — R319 Hematuria, unspecified: Secondary | ICD-10-CM | POA: Diagnosis not present

## 2022-02-20 DIAGNOSIS — E1169 Type 2 diabetes mellitus with other specified complication: Secondary | ICD-10-CM | POA: Diagnosis not present

## 2022-02-20 DIAGNOSIS — E039 Hypothyroidism, unspecified: Secondary | ICD-10-CM | POA: Diagnosis not present

## 2022-02-20 DIAGNOSIS — N200 Calculus of kidney: Secondary | ICD-10-CM | POA: Diagnosis not present

## 2022-02-20 DIAGNOSIS — E559 Vitamin D deficiency, unspecified: Secondary | ICD-10-CM | POA: Diagnosis not present

## 2022-02-25 DIAGNOSIS — E039 Hypothyroidism, unspecified: Secondary | ICD-10-CM | POA: Diagnosis not present

## 2022-02-25 DIAGNOSIS — K760 Fatty (change of) liver, not elsewhere classified: Secondary | ICD-10-CM | POA: Diagnosis not present

## 2022-02-25 DIAGNOSIS — E7849 Other hyperlipidemia: Secondary | ICD-10-CM | POA: Diagnosis not present

## 2022-02-25 DIAGNOSIS — E1169 Type 2 diabetes mellitus with other specified complication: Secondary | ICD-10-CM | POA: Diagnosis not present

## 2022-02-25 DIAGNOSIS — C679 Malignant neoplasm of bladder, unspecified: Secondary | ICD-10-CM | POA: Diagnosis not present

## 2022-02-25 DIAGNOSIS — I700 Atherosclerosis of aorta: Secondary | ICD-10-CM | POA: Diagnosis not present

## 2022-02-25 DIAGNOSIS — G4733 Obstructive sleep apnea (adult) (pediatric): Secondary | ICD-10-CM | POA: Diagnosis not present

## 2022-03-23 ENCOUNTER — Ambulatory Visit: Payer: Medicare Other | Admitting: Urology

## 2022-03-23 ENCOUNTER — Encounter: Payer: Self-pay | Admitting: Urology

## 2022-03-23 VITALS — BP 151/78 | HR 79

## 2022-03-23 DIAGNOSIS — C672 Malignant neoplasm of lateral wall of bladder: Secondary | ICD-10-CM

## 2022-03-23 DIAGNOSIS — C679 Malignant neoplasm of bladder, unspecified: Secondary | ICD-10-CM | POA: Diagnosis not present

## 2022-03-23 DIAGNOSIS — I700 Atherosclerosis of aorta: Secondary | ICD-10-CM | POA: Diagnosis not present

## 2022-03-23 DIAGNOSIS — E1169 Type 2 diabetes mellitus with other specified complication: Secondary | ICD-10-CM | POA: Diagnosis not present

## 2022-03-23 DIAGNOSIS — Z8551 Personal history of malignant neoplasm of bladder: Secondary | ICD-10-CM | POA: Diagnosis not present

## 2022-03-23 DIAGNOSIS — R600 Localized edema: Secondary | ICD-10-CM | POA: Diagnosis not present

## 2022-03-23 LAB — URINALYSIS, ROUTINE W REFLEX MICROSCOPIC
Bilirubin, UA: NEGATIVE
Glucose, UA: NEGATIVE
Ketones, UA: NEGATIVE
Nitrite, UA: NEGATIVE
Protein,UA: NEGATIVE
RBC, UA: NEGATIVE
Specific Gravity, UA: 1.025 (ref 1.005–1.030)
Urobilinogen, Ur: 0.2 mg/dL (ref 0.2–1.0)
pH, UA: 5 (ref 5.0–7.5)

## 2022-03-23 LAB — MICROSCOPIC EXAMINATION: Bacteria, UA: NONE SEEN

## 2022-03-23 MED ORDER — CIPROFLOXACIN HCL 500 MG PO TABS
500.0000 mg | ORAL_TABLET | Freq: Once | ORAL | Status: AC
  Administered 2022-03-23: 500 mg via ORAL

## 2022-03-23 NOTE — Patient Instructions (Signed)

## 2022-03-23 NOTE — Progress Notes (Signed)
   03/23/22  CC: followup bladder cancer   HPI: Darrell Rivas is a 71yo here for followup for high grade bladder cancer diagnosed 07/2020.  Blood pressure (!) 151/78, pulse 79. NED. A&Ox3.   No respiratory distress   Abd soft, NT, ND Normal phallus with bilateral descended testicles  Cystoscopy Procedure Note  Patient identification was confirmed, informed consent was obtained, and patient was prepped using Betadine solution.  Lidocaine jelly was administered per urethral meatus.     Pre-Procedure: - Inspection reveals a normal caliber ureteral meatus.  Procedure: The flexible cystoscope was introduced without difficulty - No urethral strictures/lesions are present. - Enlarged prostate  - Normal bladder neck - Bilateral ureteral orifices identified - Bladder mucosa  reveals no ulcers, tumors, or lesions - No bladder stones - No trabeculation  Retroflexion shows no intravesical prostatic protrusion   Post-Procedure: - Patient tolerated the procedure well  Assessment/ Plan: RTC 3 months for cystoscopy  No follow-ups on file.  Nicolette Bang, MD

## 2022-03-30 DIAGNOSIS — G4733 Obstructive sleep apnea (adult) (pediatric): Secondary | ICD-10-CM | POA: Diagnosis not present

## 2022-04-08 DIAGNOSIS — E119 Type 2 diabetes mellitus without complications: Secondary | ICD-10-CM | POA: Diagnosis not present

## 2022-04-08 DIAGNOSIS — R600 Localized edema: Secondary | ICD-10-CM | POA: Diagnosis not present

## 2022-04-08 DIAGNOSIS — Z8679 Personal history of other diseases of the circulatory system: Secondary | ICD-10-CM | POA: Diagnosis not present

## 2022-04-10 DIAGNOSIS — I700 Atherosclerosis of aorta: Secondary | ICD-10-CM | POA: Diagnosis not present

## 2022-04-10 DIAGNOSIS — E1369 Other specified diabetes mellitus with other specified complication: Secondary | ICD-10-CM | POA: Diagnosis not present

## 2022-04-10 DIAGNOSIS — R600 Localized edema: Secondary | ICD-10-CM | POA: Diagnosis not present

## 2022-04-29 DIAGNOSIS — G4733 Obstructive sleep apnea (adult) (pediatric): Secondary | ICD-10-CM | POA: Diagnosis not present

## 2022-05-06 DIAGNOSIS — S80812A Abrasion, left lower leg, initial encounter: Secondary | ICD-10-CM | POA: Diagnosis not present

## 2022-05-06 DIAGNOSIS — G4733 Obstructive sleep apnea (adult) (pediatric): Secondary | ICD-10-CM | POA: Diagnosis not present

## 2022-05-30 DIAGNOSIS — G4733 Obstructive sleep apnea (adult) (pediatric): Secondary | ICD-10-CM | POA: Diagnosis not present

## 2022-06-02 ENCOUNTER — Telehealth: Payer: Self-pay

## 2022-06-02 NOTE — Telephone Encounter (Signed)
Patient called office today to ask for a letter for jury duty in regards to frequency of urine after bladder cancer treatment.   Patient requesting letter to have diagnosis date and treatment dates and his urinary issues post bladder treatment.  Message sent to md

## 2022-06-03 NOTE — Progress Notes (Signed)
Letter generated with Dr. Alyson Ingles- letter signed and patient called and notified letter left at front desk for patient to pick up.

## 2022-06-24 ENCOUNTER — Ambulatory Visit: Payer: Medicare Other | Admitting: Urology

## 2022-06-24 VITALS — BP 146/81 | HR 78

## 2022-06-24 DIAGNOSIS — Z8551 Personal history of malignant neoplasm of bladder: Secondary | ICD-10-CM

## 2022-06-24 DIAGNOSIS — C672 Malignant neoplasm of lateral wall of bladder: Secondary | ICD-10-CM | POA: Diagnosis not present

## 2022-06-24 MED ORDER — CIPROFLOXACIN HCL 500 MG PO TABS: 500.0000 mg | ORAL_TABLET | Freq: Once | ORAL | Status: AC

## 2022-06-24 NOTE — Progress Notes (Signed)
   06/24/22  CC: followup bladder cancer   HPI: Mr Gelpi is a 71yo here for followup for bladder cancer diagnose din 07/2020 Blood pressure (!) 146/81, pulse 78. NED. A&Ox3.   No respiratory distress   Abd soft, NT, ND Normal phallus with bilateral descended testicles  Cystoscopy Procedure Note  Patient identification was confirmed, informed consent was obtained, and patient was prepped using Betadine solution.  Lidocaine jelly was administered per urethral meatus.     Pre-Procedure: - Inspection reveals a normal caliber ureteral meatus.  Procedure: The flexible cystoscope was introduced without difficulty - No urethral strictures/lesions are present. - Enlarged prostate  - Normal bladder neck - Bilateral ureteral orifices identified - Bladder mucosa  reveals no ulcers, tumors, or lesions - No bladder stones - No trabeculation  Retroflexion shows no intravesical prostatic protrusion   Post-Procedure: - Patient tolerated the procedure well  Assessment/ Plan: RTC 6 months with cystoscopy  No follow-ups on file.  Nicolette Bang, MD

## 2022-06-25 ENCOUNTER — Encounter: Payer: Self-pay | Admitting: Urology

## 2022-06-25 LAB — URINALYSIS, ROUTINE W REFLEX MICROSCOPIC
Bilirubin, UA: NEGATIVE
Glucose, UA: NEGATIVE
Ketones, UA: NEGATIVE
Nitrite, UA: NEGATIVE
Protein,UA: NEGATIVE
Specific Gravity, UA: 1.02 (ref 1.005–1.030)
Urobilinogen, Ur: 0.2 mg/dL (ref 0.2–1.0)
pH, UA: 5.5 (ref 5.0–7.5)

## 2022-06-25 LAB — MICROSCOPIC EXAMINATION

## 2022-06-25 NOTE — Patient Instructions (Signed)

## 2022-06-30 DIAGNOSIS — G4733 Obstructive sleep apnea (adult) (pediatric): Secondary | ICD-10-CM | POA: Diagnosis not present

## 2022-07-21 DIAGNOSIS — Z23 Encounter for immunization: Secondary | ICD-10-CM | POA: Diagnosis not present

## 2022-07-30 DIAGNOSIS — G4733 Obstructive sleep apnea (adult) (pediatric): Secondary | ICD-10-CM | POA: Diagnosis not present

## 2022-08-24 DIAGNOSIS — Z0001 Encounter for general adult medical examination with abnormal findings: Secondary | ICD-10-CM | POA: Diagnosis not present

## 2022-08-24 DIAGNOSIS — E039 Hypothyroidism, unspecified: Secondary | ICD-10-CM | POA: Diagnosis not present

## 2022-08-24 DIAGNOSIS — E559 Vitamin D deficiency, unspecified: Secondary | ICD-10-CM | POA: Diagnosis not present

## 2022-08-24 DIAGNOSIS — E1169 Type 2 diabetes mellitus with other specified complication: Secondary | ICD-10-CM | POA: Diagnosis not present

## 2022-08-24 DIAGNOSIS — E7849 Other hyperlipidemia: Secondary | ICD-10-CM | POA: Diagnosis not present

## 2022-08-28 DIAGNOSIS — I700 Atherosclerosis of aorta: Secondary | ICD-10-CM | POA: Diagnosis not present

## 2022-08-28 DIAGNOSIS — G4733 Obstructive sleep apnea (adult) (pediatric): Secondary | ICD-10-CM | POA: Diagnosis not present

## 2022-08-28 DIAGNOSIS — Z23 Encounter for immunization: Secondary | ICD-10-CM | POA: Diagnosis not present

## 2022-08-28 DIAGNOSIS — C679 Malignant neoplasm of bladder, unspecified: Secondary | ICD-10-CM | POA: Diagnosis not present

## 2022-08-28 DIAGNOSIS — K760 Fatty (change of) liver, not elsewhere classified: Secondary | ICD-10-CM | POA: Diagnosis not present

## 2022-08-28 DIAGNOSIS — Z0001 Encounter for general adult medical examination with abnormal findings: Secondary | ICD-10-CM | POA: Diagnosis not present

## 2022-08-28 DIAGNOSIS — E7849 Other hyperlipidemia: Secondary | ICD-10-CM | POA: Diagnosis not present

## 2022-08-28 DIAGNOSIS — E039 Hypothyroidism, unspecified: Secondary | ICD-10-CM | POA: Diagnosis not present

## 2022-08-28 DIAGNOSIS — R030 Elevated blood-pressure reading, without diagnosis of hypertension: Secondary | ICD-10-CM | POA: Diagnosis not present

## 2022-08-28 DIAGNOSIS — E1169 Type 2 diabetes mellitus with other specified complication: Secondary | ICD-10-CM | POA: Diagnosis not present

## 2022-08-30 DIAGNOSIS — G4733 Obstructive sleep apnea (adult) (pediatric): Secondary | ICD-10-CM | POA: Diagnosis not present

## 2022-09-29 DIAGNOSIS — G4733 Obstructive sleep apnea (adult) (pediatric): Secondary | ICD-10-CM | POA: Diagnosis not present

## 2022-10-13 ENCOUNTER — Telehealth: Payer: Self-pay

## 2022-10-13 NOTE — Telephone Encounter (Signed)
Patient call in today voicing about urinalysis that was done through the [place] that showed an infection and want to do a urine drop off. Patient voiced that he is not having any urinary sxs, made patient aware that a urine drop off will not be needed, but to please have the [place] fax copies of his medical records for Dr. Alyson Ingles. Patient voiced understanding.

## 2022-10-20 DIAGNOSIS — G4733 Obstructive sleep apnea (adult) (pediatric): Secondary | ICD-10-CM | POA: Diagnosis not present

## 2022-10-30 DIAGNOSIS — G4733 Obstructive sleep apnea (adult) (pediatric): Secondary | ICD-10-CM | POA: Diagnosis not present

## 2022-11-30 DIAGNOSIS — G4733 Obstructive sleep apnea (adult) (pediatric): Secondary | ICD-10-CM | POA: Diagnosis not present

## 2022-12-16 ENCOUNTER — Other Ambulatory Visit: Payer: Medicare Other

## 2022-12-16 ENCOUNTER — Other Ambulatory Visit: Payer: Self-pay

## 2022-12-16 DIAGNOSIS — C672 Malignant neoplasm of lateral wall of bladder: Secondary | ICD-10-CM

## 2022-12-17 LAB — PSA: Prostate Specific Ag, Serum: 0.9 ng/mL (ref 0.0–4.0)

## 2022-12-23 ENCOUNTER — Encounter: Payer: Self-pay | Admitting: Urology

## 2022-12-23 ENCOUNTER — Ambulatory Visit: Payer: Medicare Other | Admitting: Urology

## 2022-12-23 VITALS — BP 159/83 | HR 78 | Ht 67.0 in | Wt 240.0 lb

## 2022-12-23 DIAGNOSIS — C672 Malignant neoplasm of lateral wall of bladder: Secondary | ICD-10-CM

## 2022-12-23 DIAGNOSIS — M545 Low back pain, unspecified: Secondary | ICD-10-CM | POA: Diagnosis not present

## 2022-12-23 DIAGNOSIS — Z8551 Personal history of malignant neoplasm of bladder: Secondary | ICD-10-CM | POA: Diagnosis not present

## 2022-12-23 LAB — URINALYSIS, ROUTINE W REFLEX MICROSCOPIC
Bilirubin, UA: NEGATIVE
Glucose, UA: NEGATIVE
Ketones, UA: NEGATIVE
Nitrite, UA: NEGATIVE
Protein,UA: NEGATIVE
Specific Gravity, UA: 1.025 (ref 1.005–1.030)
Urobilinogen, Ur: 0.2 mg/dL (ref 0.2–1.0)
pH, UA: 5 (ref 5.0–7.5)

## 2022-12-23 LAB — MICROSCOPIC EXAMINATION: Bacteria, UA: NONE SEEN

## 2022-12-23 MED ORDER — CIPROFLOXACIN HCL 500 MG PO TABS
500.0000 mg | ORAL_TABLET | Freq: Once | ORAL | Status: AC
  Administered 2022-12-23: 500 mg via ORAL

## 2022-12-23 NOTE — Patient Instructions (Signed)

## 2022-12-23 NOTE — Progress Notes (Signed)
   12/23/22  CC: follouwp bladder cancer   HPI: Darrell Rivas is a 72yo here for followup for bladder cancer Blood pressure (!) 159/83, pulse 78, height 5\' 7"  (1.702 m), weight 240 lb (108.9 kg). NED. A&Ox3.   No respiratory distress   Abd soft, NT, ND Normal phallus with bilateral descended testicles  Cystoscopy Procedure Note  Patient identification was confirmed, informed consent was obtained, and patient was prepped using Betadine solution.  Lidocaine jelly was administered per urethral meatus.     Pre-Procedure: - Inspection reveals a normal caliber ureteral meatus.  Procedure: The flexible cystoscope was introduced without difficulty - No urethral strictures/lesions are present. - Enlarged prostate  - Normal bladder neck - Bilateral ureteral orifices identified - Bladder mucosa  reveals no ulcers, tumors, or lesions - No bladder stones - No trabeculation   Post-Procedure: - Patient tolerated the procedure well  Assessment/ Plan: Followup 6 months for cystoscopy  No follow-ups on file.  Nicolette Bang, MD

## 2022-12-29 DIAGNOSIS — G4733 Obstructive sleep apnea (adult) (pediatric): Secondary | ICD-10-CM | POA: Diagnosis not present

## 2023-01-18 DIAGNOSIS — G4733 Obstructive sleep apnea (adult) (pediatric): Secondary | ICD-10-CM | POA: Diagnosis not present

## 2023-01-22 DIAGNOSIS — L237 Allergic contact dermatitis due to plants, except food: Secondary | ICD-10-CM | POA: Diagnosis not present

## 2023-01-29 DIAGNOSIS — L259 Unspecified contact dermatitis, unspecified cause: Secondary | ICD-10-CM | POA: Diagnosis not present

## 2023-02-12 DIAGNOSIS — E782 Mixed hyperlipidemia: Secondary | ICD-10-CM | POA: Diagnosis not present

## 2023-02-12 DIAGNOSIS — E039 Hypothyroidism, unspecified: Secondary | ICD-10-CM | POA: Diagnosis not present

## 2023-02-12 DIAGNOSIS — E1169 Type 2 diabetes mellitus with other specified complication: Secondary | ICD-10-CM | POA: Diagnosis not present

## 2023-02-12 DIAGNOSIS — R5382 Chronic fatigue, unspecified: Secondary | ICD-10-CM | POA: Diagnosis not present

## 2023-02-12 DIAGNOSIS — E7849 Other hyperlipidemia: Secondary | ICD-10-CM | POA: Diagnosis not present

## 2023-02-19 DIAGNOSIS — E1169 Type 2 diabetes mellitus with other specified complication: Secondary | ICD-10-CM | POA: Diagnosis not present

## 2023-02-19 DIAGNOSIS — E039 Hypothyroidism, unspecified: Secondary | ICD-10-CM | POA: Diagnosis not present

## 2023-02-19 DIAGNOSIS — R350 Frequency of micturition: Secondary | ICD-10-CM | POA: Diagnosis not present

## 2023-02-19 DIAGNOSIS — E7849 Other hyperlipidemia: Secondary | ICD-10-CM | POA: Diagnosis not present

## 2023-02-19 DIAGNOSIS — R030 Elevated blood-pressure reading, without diagnosis of hypertension: Secondary | ICD-10-CM | POA: Diagnosis not present

## 2023-02-19 DIAGNOSIS — K760 Fatty (change of) liver, not elsewhere classified: Secondary | ICD-10-CM | POA: Diagnosis not present

## 2023-02-19 DIAGNOSIS — C679 Malignant neoplasm of bladder, unspecified: Secondary | ICD-10-CM | POA: Diagnosis not present

## 2023-02-19 DIAGNOSIS — I700 Atherosclerosis of aorta: Secondary | ICD-10-CM | POA: Diagnosis not present

## 2023-02-19 DIAGNOSIS — G4733 Obstructive sleep apnea (adult) (pediatric): Secondary | ICD-10-CM | POA: Diagnosis not present

## 2023-05-20 DIAGNOSIS — E039 Hypothyroidism, unspecified: Secondary | ICD-10-CM | POA: Diagnosis not present

## 2023-05-20 DIAGNOSIS — E1169 Type 2 diabetes mellitus with other specified complication: Secondary | ICD-10-CM | POA: Diagnosis not present

## 2023-06-16 ENCOUNTER — Ambulatory Visit: Payer: Medicare Other | Admitting: Urology

## 2023-06-16 VITALS — BP 155/89 | HR 73

## 2023-06-16 DIAGNOSIS — C672 Malignant neoplasm of lateral wall of bladder: Secondary | ICD-10-CM | POA: Diagnosis not present

## 2023-06-16 DIAGNOSIS — Z8551 Personal history of malignant neoplasm of bladder: Secondary | ICD-10-CM

## 2023-06-16 DIAGNOSIS — B349 Viral infection, unspecified: Secondary | ICD-10-CM | POA: Diagnosis not present

## 2023-06-16 MED ORDER — CIPROFLOXACIN HCL 500 MG PO TABS
500.0000 mg | ORAL_TABLET | Freq: Once | ORAL | Status: AC
  Administered 2023-06-16: 500 mg via ORAL

## 2023-06-16 NOTE — Progress Notes (Signed)
   06/16/23  CC: followup bladder cancer   HPI: Darrell Rivas is a 71yo here for followup fro bladder cancer Blood pressure (!) 155/89, pulse 73. NED. A&Ox3.   No respiratory distress   Abd soft, NT, ND Normal phallus with bilateral descended testicles  Cystoscopy Procedure Note  Patient identification was confirmed, informed consent was obtained, and patient was prepped using Betadine solution.  Lidocaine jelly was administered per urethral meatus.     Pre-Procedure: - Inspection reveals a normal caliber ureteral meatus.  Procedure: The flexible cystoscope was introduced without difficulty - No urethral strictures/lesions are present. - Enlarged prostate  - Normal bladder neck - Bilateral ureteral orifices identified - Bladder mucosa  reveals no ulcers, tumors, or lesions - No bladder stones - No trabeculation    Post-Procedure: - Patient tolerated the procedure well  Assessment/ Plan: Urine for cystology  No follow-ups on file.  Wilkie Aye, MD

## 2023-06-18 LAB — CYTOLOGY, URINE

## 2023-06-22 ENCOUNTER — Encounter: Payer: Self-pay | Admitting: Urology

## 2023-06-22 NOTE — Patient Instructions (Signed)

## 2023-06-22 NOTE — Progress Notes (Signed)
Letter sent.

## 2023-06-30 DIAGNOSIS — N390 Urinary tract infection, site not specified: Secondary | ICD-10-CM | POA: Diagnosis not present

## 2023-08-24 DIAGNOSIS — E0590 Thyrotoxicosis, unspecified without thyrotoxic crisis or storm: Secondary | ICD-10-CM | POA: Diagnosis not present

## 2023-08-24 DIAGNOSIS — E7849 Other hyperlipidemia: Secondary | ICD-10-CM | POA: Diagnosis not present

## 2023-08-24 DIAGNOSIS — E1169 Type 2 diabetes mellitus with other specified complication: Secondary | ICD-10-CM | POA: Diagnosis not present

## 2023-08-24 DIAGNOSIS — E039 Hypothyroidism, unspecified: Secondary | ICD-10-CM | POA: Diagnosis not present

## 2023-08-24 DIAGNOSIS — Z0001 Encounter for general adult medical examination with abnormal findings: Secondary | ICD-10-CM | POA: Diagnosis not present

## 2023-08-31 DIAGNOSIS — R350 Frequency of micturition: Secondary | ICD-10-CM | POA: Diagnosis not present

## 2023-08-31 DIAGNOSIS — E1169 Type 2 diabetes mellitus with other specified complication: Secondary | ICD-10-CM | POA: Diagnosis not present

## 2023-08-31 DIAGNOSIS — Z0001 Encounter for general adult medical examination with abnormal findings: Secondary | ICD-10-CM | POA: Diagnosis not present

## 2023-08-31 DIAGNOSIS — C679 Malignant neoplasm of bladder, unspecified: Secondary | ICD-10-CM | POA: Diagnosis not present

## 2023-08-31 DIAGNOSIS — Z1389 Encounter for screening for other disorder: Secondary | ICD-10-CM | POA: Diagnosis not present

## 2023-08-31 DIAGNOSIS — R030 Elevated blood-pressure reading, without diagnosis of hypertension: Secondary | ICD-10-CM | POA: Diagnosis not present

## 2023-08-31 DIAGNOSIS — E7849 Other hyperlipidemia: Secondary | ICD-10-CM | POA: Diagnosis not present

## 2023-08-31 DIAGNOSIS — K760 Fatty (change of) liver, not elsewhere classified: Secondary | ICD-10-CM | POA: Diagnosis not present

## 2023-08-31 DIAGNOSIS — I700 Atherosclerosis of aorta: Secondary | ICD-10-CM | POA: Diagnosis not present

## 2023-08-31 DIAGNOSIS — Z23 Encounter for immunization: Secondary | ICD-10-CM | POA: Diagnosis not present

## 2023-08-31 DIAGNOSIS — E039 Hypothyroidism, unspecified: Secondary | ICD-10-CM | POA: Diagnosis not present

## 2023-12-03 DIAGNOSIS — D2261 Melanocytic nevi of right upper limb, including shoulder: Secondary | ICD-10-CM | POA: Diagnosis not present

## 2023-12-03 DIAGNOSIS — L821 Other seborrheic keratosis: Secondary | ICD-10-CM | POA: Diagnosis not present

## 2023-12-03 DIAGNOSIS — D229 Melanocytic nevi, unspecified: Secondary | ICD-10-CM | POA: Diagnosis not present

## 2023-12-03 DIAGNOSIS — D2262 Melanocytic nevi of left upper limb, including shoulder: Secondary | ICD-10-CM | POA: Diagnosis not present

## 2023-12-03 DIAGNOSIS — I781 Nevus, non-neoplastic: Secondary | ICD-10-CM | POA: Diagnosis not present

## 2023-12-03 DIAGNOSIS — L820 Inflamed seborrheic keratosis: Secondary | ICD-10-CM | POA: Diagnosis not present

## 2023-12-22 ENCOUNTER — Ambulatory Visit: Payer: Medicare Other | Admitting: Urology

## 2023-12-22 VITALS — BP 127/88 | HR 73

## 2023-12-22 DIAGNOSIS — C672 Malignant neoplasm of lateral wall of bladder: Secondary | ICD-10-CM | POA: Diagnosis not present

## 2023-12-22 DIAGNOSIS — Z8551 Personal history of malignant neoplasm of bladder: Secondary | ICD-10-CM

## 2023-12-22 LAB — URINALYSIS, ROUTINE W REFLEX MICROSCOPIC
Bilirubin, UA: NEGATIVE
Glucose, UA: NEGATIVE
Ketones, UA: NEGATIVE
Nitrite, UA: NEGATIVE
Specific Gravity, UA: 1.02 (ref 1.005–1.030)
Urobilinogen, Ur: 0.2 mg/dL (ref 0.2–1.0)
pH, UA: 6 (ref 5.0–7.5)

## 2023-12-22 LAB — MICROSCOPIC EXAMINATION: WBC, UA: >30 /HPF — AB (ref 0–5)

## 2023-12-22 MED ORDER — CIPROFLOXACIN HCL 500 MG PO TABS
500.0000 mg | ORAL_TABLET | Freq: Once | ORAL | Status: AC
  Administered 2023-12-22: 500 mg via ORAL

## 2023-12-22 NOTE — Progress Notes (Signed)
   12/22/23  CC: followup bladder cancer   HPI: Mr Darrell Rivas is a 72yo here for followup fro bladder cancer Blood pressure 127/88, pulse 73. NED. A&Ox3.   No respiratory distress   Abd soft, NT, ND Normal phallus with bilateral descended testicles  Cystoscopy Procedure Note  Patient identification was confirmed, informed consent was obtained, and patient was prepped using Betadine solution.  Lidocaine jelly was administered per urethral meatus.     Pre-Procedure: - Inspection reveals a normal caliber ureteral meatus.  Procedure: The flexible cystoscope was introduced without difficulty - No urethral strictures/lesions are present. - Enlarged prostate  - Normal bladder neck - Bilateral ureteral orifices identified - Bladder mucosa  reveals no ulcers, tumors, or lesions - No bladder stones - No trabeculation    Post-Procedure: - Patient tolerated the procedure well  Assessment/ Plan: Followup 1 year for cystoscopy  No follow-ups on file.  Wilkie Aye, MD

## 2023-12-28 ENCOUNTER — Encounter: Payer: Self-pay | Admitting: Urology

## 2023-12-28 NOTE — Patient Instructions (Signed)

## 2024-01-18 DIAGNOSIS — D494 Neoplasm of unspecified behavior of bladder: Secondary | ICD-10-CM | POA: Diagnosis not present

## 2024-01-18 DIAGNOSIS — R300 Dysuria: Secondary | ICD-10-CM | POA: Diagnosis not present

## 2024-01-18 DIAGNOSIS — S39012A Strain of muscle, fascia and tendon of lower back, initial encounter: Secondary | ICD-10-CM | POA: Diagnosis not present

## 2024-01-18 DIAGNOSIS — E7849 Other hyperlipidemia: Secondary | ICD-10-CM | POA: Diagnosis not present

## 2024-02-21 DIAGNOSIS — E1169 Type 2 diabetes mellitus with other specified complication: Secondary | ICD-10-CM | POA: Diagnosis not present

## 2024-02-21 DIAGNOSIS — E039 Hypothyroidism, unspecified: Secondary | ICD-10-CM | POA: Diagnosis not present

## 2024-02-21 DIAGNOSIS — E7849 Other hyperlipidemia: Secondary | ICD-10-CM | POA: Diagnosis not present

## 2024-03-01 DIAGNOSIS — E039 Hypothyroidism, unspecified: Secondary | ICD-10-CM | POA: Diagnosis not present

## 2024-03-01 DIAGNOSIS — M48061 Spinal stenosis, lumbar region without neurogenic claudication: Secondary | ICD-10-CM | POA: Diagnosis not present

## 2024-03-01 DIAGNOSIS — E1169 Type 2 diabetes mellitus with other specified complication: Secondary | ICD-10-CM | POA: Diagnosis not present

## 2024-03-01 DIAGNOSIS — M67441 Ganglion, right hand: Secondary | ICD-10-CM | POA: Diagnosis not present

## 2024-03-17 DIAGNOSIS — U071 COVID-19: Secondary | ICD-10-CM | POA: Diagnosis not present

## 2024-03-17 DIAGNOSIS — M1611 Unilateral primary osteoarthritis, right hip: Secondary | ICD-10-CM | POA: Diagnosis not present

## 2024-04-14 DIAGNOSIS — E1169 Type 2 diabetes mellitus with other specified complication: Secondary | ICD-10-CM | POA: Diagnosis not present

## 2024-04-14 DIAGNOSIS — M67441 Ganglion, right hand: Secondary | ICD-10-CM | POA: Diagnosis not present

## 2024-04-14 DIAGNOSIS — M48061 Spinal stenosis, lumbar region without neurogenic claudication: Secondary | ICD-10-CM | POA: Diagnosis not present

## 2024-04-18 DIAGNOSIS — M67441 Ganglion, right hand: Secondary | ICD-10-CM | POA: Diagnosis not present

## 2024-04-18 DIAGNOSIS — M79644 Pain in right finger(s): Secondary | ICD-10-CM | POA: Diagnosis not present

## 2024-05-24 DIAGNOSIS — M6281 Muscle weakness (generalized): Secondary | ICD-10-CM | POA: Diagnosis not present

## 2024-05-24 DIAGNOSIS — M2560 Stiffness of unspecified joint, not elsewhere classified: Secondary | ICD-10-CM | POA: Diagnosis not present

## 2024-05-24 DIAGNOSIS — M5459 Other low back pain: Secondary | ICD-10-CM | POA: Diagnosis not present

## 2024-05-24 DIAGNOSIS — M25551 Pain in right hip: Secondary | ICD-10-CM | POA: Diagnosis not present

## 2024-06-08 DIAGNOSIS — M2560 Stiffness of unspecified joint, not elsewhere classified: Secondary | ICD-10-CM | POA: Diagnosis not present

## 2024-06-08 DIAGNOSIS — M5459 Other low back pain: Secondary | ICD-10-CM | POA: Diagnosis not present

## 2024-06-08 DIAGNOSIS — M25551 Pain in right hip: Secondary | ICD-10-CM | POA: Diagnosis not present

## 2024-06-08 DIAGNOSIS — M6281 Muscle weakness (generalized): Secondary | ICD-10-CM | POA: Diagnosis not present

## 2024-06-09 DIAGNOSIS — M5459 Other low back pain: Secondary | ICD-10-CM | POA: Diagnosis not present

## 2024-06-09 DIAGNOSIS — M6281 Muscle weakness (generalized): Secondary | ICD-10-CM | POA: Diagnosis not present

## 2024-06-09 DIAGNOSIS — M25551 Pain in right hip: Secondary | ICD-10-CM | POA: Diagnosis not present

## 2024-06-09 DIAGNOSIS — M2560 Stiffness of unspecified joint, not elsewhere classified: Secondary | ICD-10-CM | POA: Diagnosis not present

## 2024-06-14 DIAGNOSIS — M6281 Muscle weakness (generalized): Secondary | ICD-10-CM | POA: Diagnosis not present

## 2024-06-14 DIAGNOSIS — M5459 Other low back pain: Secondary | ICD-10-CM | POA: Diagnosis not present

## 2024-06-14 DIAGNOSIS — M25551 Pain in right hip: Secondary | ICD-10-CM | POA: Diagnosis not present

## 2024-06-14 DIAGNOSIS — M2560 Stiffness of unspecified joint, not elsewhere classified: Secondary | ICD-10-CM | POA: Diagnosis not present

## 2024-06-16 DIAGNOSIS — M6281 Muscle weakness (generalized): Secondary | ICD-10-CM | POA: Diagnosis not present

## 2024-06-16 DIAGNOSIS — M5459 Other low back pain: Secondary | ICD-10-CM | POA: Diagnosis not present

## 2024-06-16 DIAGNOSIS — M25551 Pain in right hip: Secondary | ICD-10-CM | POA: Diagnosis not present

## 2024-06-16 DIAGNOSIS — M2560 Stiffness of unspecified joint, not elsewhere classified: Secondary | ICD-10-CM | POA: Diagnosis not present

## 2024-06-21 DIAGNOSIS — M5459 Other low back pain: Secondary | ICD-10-CM | POA: Diagnosis not present

## 2024-06-21 DIAGNOSIS — M2560 Stiffness of unspecified joint, not elsewhere classified: Secondary | ICD-10-CM | POA: Diagnosis not present

## 2024-06-21 DIAGNOSIS — M6281 Muscle weakness (generalized): Secondary | ICD-10-CM | POA: Diagnosis not present

## 2024-06-21 DIAGNOSIS — M25551 Pain in right hip: Secondary | ICD-10-CM | POA: Diagnosis not present

## 2024-06-23 DIAGNOSIS — M6281 Muscle weakness (generalized): Secondary | ICD-10-CM | POA: Diagnosis not present

## 2024-06-23 DIAGNOSIS — M5459 Other low back pain: Secondary | ICD-10-CM | POA: Diagnosis not present

## 2024-06-23 DIAGNOSIS — M25551 Pain in right hip: Secondary | ICD-10-CM | POA: Diagnosis not present

## 2024-06-23 DIAGNOSIS — M2560 Stiffness of unspecified joint, not elsewhere classified: Secondary | ICD-10-CM | POA: Diagnosis not present

## 2024-06-28 DIAGNOSIS — N390 Urinary tract infection, site not specified: Secondary | ICD-10-CM | POA: Diagnosis not present

## 2024-06-28 DIAGNOSIS — R300 Dysuria: Secondary | ICD-10-CM | POA: Diagnosis not present

## 2024-06-29 DIAGNOSIS — M6281 Muscle weakness (generalized): Secondary | ICD-10-CM | POA: Diagnosis not present

## 2024-06-29 DIAGNOSIS — M5459 Other low back pain: Secondary | ICD-10-CM | POA: Diagnosis not present

## 2024-06-29 DIAGNOSIS — M25551 Pain in right hip: Secondary | ICD-10-CM | POA: Diagnosis not present

## 2024-06-29 DIAGNOSIS — M2560 Stiffness of unspecified joint, not elsewhere classified: Secondary | ICD-10-CM | POA: Diagnosis not present

## 2024-07-03 DIAGNOSIS — M6281 Muscle weakness (generalized): Secondary | ICD-10-CM | POA: Diagnosis not present

## 2024-07-03 DIAGNOSIS — M5459 Other low back pain: Secondary | ICD-10-CM | POA: Diagnosis not present

## 2024-07-03 DIAGNOSIS — M25551 Pain in right hip: Secondary | ICD-10-CM | POA: Diagnosis not present

## 2024-07-03 DIAGNOSIS — M2560 Stiffness of unspecified joint, not elsewhere classified: Secondary | ICD-10-CM | POA: Diagnosis not present

## 2024-07-05 DIAGNOSIS — M6281 Muscle weakness (generalized): Secondary | ICD-10-CM | POA: Diagnosis not present

## 2024-07-05 DIAGNOSIS — M5459 Other low back pain: Secondary | ICD-10-CM | POA: Diagnosis not present

## 2024-07-05 DIAGNOSIS — M2560 Stiffness of unspecified joint, not elsewhere classified: Secondary | ICD-10-CM | POA: Diagnosis not present

## 2024-07-05 DIAGNOSIS — M25551 Pain in right hip: Secondary | ICD-10-CM | POA: Diagnosis not present

## 2024-12-20 ENCOUNTER — Other Ambulatory Visit: Admitting: Urology
